# Patient Record
Sex: Male | Born: 1935 | Race: White | Hispanic: No | Marital: Married | Smoking: Former smoker
Health system: Southern US, Community
[De-identification: ages and names within clinical notes are randomized; demographics above are authoritative.]

## PROBLEM LIST (undated history)

## (undated) DIAGNOSIS — N401 Enlarged prostate with lower urinary tract symptoms: Secondary | ICD-10-CM

## (undated) DIAGNOSIS — Z972 Presence of dental prosthetic device (complete) (partial): Secondary | ICD-10-CM

## (undated) DIAGNOSIS — N138 Other obstructive and reflux uropathy: Secondary | ICD-10-CM

## (undated) DIAGNOSIS — Z978 Presence of other specified devices: Secondary | ICD-10-CM

## (undated) DIAGNOSIS — R339 Retention of urine, unspecified: Secondary | ICD-10-CM

## (undated) DIAGNOSIS — I10 Essential (primary) hypertension: Secondary | ICD-10-CM

## (undated) DIAGNOSIS — D649 Anemia, unspecified: Secondary | ICD-10-CM

## (undated) DIAGNOSIS — Z973 Presence of spectacles and contact lenses: Secondary | ICD-10-CM

## (undated) DIAGNOSIS — M1990 Unspecified osteoarthritis, unspecified site: Secondary | ICD-10-CM

## (undated) HISTORY — DX: Essential (primary) hypertension: I10

## (undated) HISTORY — PX: CATARACT EXTRACTION: SUR2

## (undated) HISTORY — PX: BLEPHAROPLASTY: SUR158

## (undated) HISTORY — PX: CATARACT EXTRACTION, BILATERAL: SHX1313

---
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
## 2016-08-15 DIAGNOSIS — H02214 Cicatricial lagophthalmos left upper eyelid: Secondary | ICD-10-CM | POA: Diagnosis not present

## 2016-08-15 DIAGNOSIS — H26493 Other secondary cataract, bilateral: Secondary | ICD-10-CM | POA: Diagnosis not present

## 2016-11-08 DIAGNOSIS — E559 Vitamin D deficiency, unspecified: Secondary | ICD-10-CM | POA: Diagnosis not present

## 2016-11-08 DIAGNOSIS — Z0000 Encounter for general adult medical examination without abnormal findings: Secondary | ICD-10-CM | POA: Diagnosis not present

## 2016-11-15 DIAGNOSIS — Z0000 Encounter for general adult medical examination without abnormal findings: Secondary | ICD-10-CM | POA: Diagnosis not present

## 2016-12-04 DIAGNOSIS — Z23 Encounter for immunization: Secondary | ICD-10-CM | POA: Diagnosis not present

## 2017-05-23 DIAGNOSIS — L821 Other seborrheic keratosis: Secondary | ICD-10-CM | POA: Diagnosis not present

## 2017-05-23 DIAGNOSIS — L720 Epidermal cyst: Secondary | ICD-10-CM | POA: Diagnosis not present

## 2017-05-23 DIAGNOSIS — D229 Melanocytic nevi, unspecified: Secondary | ICD-10-CM | POA: Diagnosis not present

## 2017-08-07 DIAGNOSIS — H26493 Other secondary cataract, bilateral: Secondary | ICD-10-CM | POA: Diagnosis not present

## 2017-10-17 ENCOUNTER — Ambulatory Visit (INDEPENDENT_AMBULATORY_CARE_PROVIDER_SITE_OTHER): Payer: Medicare Other | Admitting: Family Medicine

## 2017-10-17 ENCOUNTER — Encounter: Payer: Self-pay | Admitting: Family Medicine

## 2017-10-17 VITALS — BP 138/76 | HR 55 | Temp 98.5°F | Ht 70.0 in | Wt 183.6 lb

## 2017-10-17 DIAGNOSIS — H02125 Mechanical ectropion of left lower eyelid: Secondary | ICD-10-CM | POA: Insufficient documentation

## 2017-10-17 DIAGNOSIS — R5383 Other fatigue: Secondary | ICD-10-CM

## 2017-10-17 DIAGNOSIS — R739 Hyperglycemia, unspecified: Secondary | ICD-10-CM | POA: Diagnosis not present

## 2017-10-17 DIAGNOSIS — R030 Elevated blood-pressure reading, without diagnosis of hypertension: Secondary | ICD-10-CM | POA: Diagnosis not present

## 2017-10-17 DIAGNOSIS — H26493 Other secondary cataract, bilateral: Secondary | ICD-10-CM | POA: Insufficient documentation

## 2017-10-17 DIAGNOSIS — H18519 Endothelial corneal dystrophy, unspecified eye: Secondary | ICD-10-CM | POA: Insufficient documentation

## 2017-10-17 DIAGNOSIS — Z125 Encounter for screening for malignant neoplasm of prostate: Secondary | ICD-10-CM | POA: Diagnosis not present

## 2017-10-17 DIAGNOSIS — Z13220 Encounter for screening for lipoid disorders: Secondary | ICD-10-CM | POA: Diagnosis not present

## 2017-10-17 DIAGNOSIS — H02214 Cicatricial lagophthalmos left upper eyelid: Secondary | ICD-10-CM | POA: Insufficient documentation

## 2017-10-17 DIAGNOSIS — H01015 Ulcerative blepharitis left lower eyelid: Secondary | ICD-10-CM | POA: Insufficient documentation

## 2017-10-17 DIAGNOSIS — H1851 Endothelial corneal dystrophy: Secondary | ICD-10-CM

## 2017-10-17 DIAGNOSIS — R972 Elevated prostate specific antigen [PSA]: Secondary | ICD-10-CM | POA: Insufficient documentation

## 2017-10-17 DIAGNOSIS — H0014 Chalazion left upper eyelid: Secondary | ICD-10-CM | POA: Insufficient documentation

## 2017-10-17 LAB — LIPID PANEL
Cholesterol: 157 mg/dL (ref 0–200)
HDL: 51.4 mg/dL (ref >39.00)
NonHDL: 105.5
Total CHOL/HDL Ratio: 3
Triglycerides: 221 mg/dL — ABNORMAL HIGH (ref 0.0–149.0)
VLDL: 44.2 mg/dL — ABNORMAL HIGH (ref 0.0–40.0)

## 2017-10-17 LAB — COMPREHENSIVE METABOLIC PANEL
ALT: 15 U/L (ref 0–53)
AST: 15 U/L (ref 0–37)
Albumin: 4.2 g/dL (ref 3.5–5.2)
Alkaline Phosphatase: 67 U/L (ref 39–117)
BUN: 24 mg/dL — ABNORMAL HIGH (ref 6–23)
CO2: 31 mEq/L (ref 19–32)
Calcium: 9.3 mg/dL (ref 8.4–10.5)
Chloride: 106 mEq/L (ref 96–112)
Creatinine, Ser: 1.01 mg/dL (ref 0.40–1.50)
GFR: 74.86 mL/min (ref >60.00)
Glucose, Bld: 102 mg/dL — ABNORMAL HIGH (ref 70–99)
Potassium: 4.5 mEq/L (ref 3.5–5.1)
Sodium: 142 mEq/L (ref 135–145)
Total Bilirubin: 0.6 mg/dL (ref 0.2–1.2)
Total Protein: 6.3 g/dL (ref 6.0–8.3)

## 2017-10-17 LAB — CBC WITH DIFFERENTIAL/PLATELET
Basophils Absolute: 0 10*3/uL (ref 0.0–0.1)
Basophils Relative: 0.6 % (ref 0.0–3.0)
Eosinophils Absolute: 0.2 10*3/uL (ref 0.0–0.7)
Eosinophils Relative: 2.8 % (ref 0.0–5.0)
HCT: 40.7 % (ref 39.0–52.0)
Hemoglobin: 14 g/dL (ref 13.0–17.0)
Lymphocytes Relative: 24.1 % (ref 12.0–46.0)
Lymphs Abs: 1.4 10*3/uL (ref 0.7–4.0)
MCHC: 34.4 g/dL (ref 30.0–36.0)
MCV: 95.7 fl (ref 78.0–100.0)
Monocytes Absolute: 0.6 10*3/uL (ref 0.1–1.0)
Monocytes Relative: 11.3 % (ref 3.0–12.0)
Neutro Abs: 3.4 10*3/uL (ref 1.4–7.7)
Neutrophils Relative %: 61.2 % (ref 43.0–77.0)
Platelets: 140 10*3/uL — ABNORMAL LOW (ref 150.0–400.0)
RBC: 4.25 Mil/uL (ref 4.22–5.81)
RDW: 13.8 % (ref 11.5–15.5)
WBC: 5.6 10*3/uL (ref 4.0–10.5)

## 2017-10-17 LAB — TSH: TSH: 1.95 u[IU]/mL (ref 0.35–4.50)

## 2017-10-17 LAB — PSA: PSA: 8.6 ng/mL — ABNORMAL HIGH (ref 0.10–4.00)

## 2017-10-17 LAB — HEMOGLOBIN A1C: Hgb A1c MFr Bld: 5.8 % (ref 4.6–6.5)

## 2017-10-17 LAB — LDL CHOLESTEROL, DIRECT: Direct LDL: 73 mg/dL

## 2017-10-17 NOTE — Progress Notes (Signed)
Terrence Hinton is a 84 y.o. male is here to [place].   Patient Care Team: Briscoe Deutscher, DO as PCP - General (Family Medicine)   History of Present Illness:   Terrence Hinton, CMA acting as scribe for Dr. Briscoe Deutscher.   HPI: Patient in office to establish care. He was sent here by wife who is also a patient of Dr. Juleen Hinton. He has not issues to talk about today. See Assessment and Plan section for Problem Based Charting of issues discussed today.   Health Maintenance Due  Topic Date Due  . TETANUS/TDAP  02/26/1953  . PNA vac Low Risk Adult (1 of 2 - PCV13) 02/27/1999  . INFLUENZA VACCINE  10/11/2017   Depression screen PHQ 2/9 10/17/2017  Decreased Interest 0  Down, Depressed, Hopeless 0  PHQ - 2 Score 0  Altered sleeping 0  Tired, decreased energy 0  Change in appetite 0  Feeling bad or failure about yourself  0  Trouble concentrating 0  Moving slowly or fidgety/restless 0  Suicidal thoughts 0  PHQ-9 Score 0  Difficult doing work/chores Not difficult at all    PMHx, SurgHx, SocialHx, Medications, and Allergies were reviewed in the Visit Navigator and updated as appropriate.   History reviewed. No pertinent past medical history.  Past Surgical History:  Procedure Laterality Date  . TONSILLECTOMY  1945   Family History  Problem Relation Age of Onset  . Heart attack Mother   . Cancer Father   . Early death Father     Social History   Tobacco Use  . Smoking status: Former Smoker    Last attempt to quit: 10/17/1977    Years since quitting: 40.0  . Smokeless tobacco: Never Used  Substance Use Topics  . Alcohol use: Not Currently  . Drug use: Never   Current Medications and Allergies:   .  aspirin EC 81 MG tablet, Take 81 mg by mouth daily., Disp: , Rfl:  .  glucosamine-chondroitin 500-400 MG tablet, Take 1 tablet by mouth 3 (three) times daily., Disp: , Rfl:  .  Multiple Vitamin (MULTIVITAMIN) capsule, Take 1 capsule by mouth daily., Disp: , Rfl:  .   Omega-3 Fatty Acids (FISH OIL) 1000 MG CAPS, Take by mouth., Disp: , Rfl:  .  PRENAT-FECBN-FEBISG-FA-FISHOIL PO, Take by mouth., Disp: , Rfl:   No Known Allergies   Review of Systems:   Pertinent items are noted in the HPI. Otherwise, ROS is negative.  Vitals:   Vitals:   10/17/17 1255 10/17/17 1318  BP: (!) 148/82 138/76  Pulse: (!) 55   Temp: 98.5 F (36.9 C)   TempSrc: Oral   SpO2: 96%   Weight: 183 lb 9.6 oz (83.3 kg)   Height: 5\' 10"  (1.778 m)      Body mass index is 26.34 kg/m.  Physical Exam:   Physical Exam  Constitutional: He is oriented to person, place, and time. He appears well-developed and well-nourished. No distress.  HENT:  Head: Normocephalic and atraumatic.  Right Ear: External ear normal.  Left Ear: External ear normal.  Nose: Nose normal.  Mouth/Throat: Oropharynx is clear and moist.  Eyes: Pupils are equal, round, and reactive to light. Conjunctivae and EOM are normal.  Neck: Normal range of motion. Neck supple.  Cardiovascular: Normal rate, regular rhythm, normal heart sounds and intact distal pulses.  Pulmonary/Chest: Effort normal and breath sounds normal.  Abdominal: Soft. Bowel sounds are normal.  Musculoskeletal: Normal range of motion.  Neurological: He is  alert and oriented to person, place, and time.  Skin: Skin is warm and dry.  Psychiatric: He has a normal mood and affect. His behavior is normal. Judgment and thought content normal.  Nursing note and vitals reviewed.  Assessment and Plan:   [name] was seen today for establish care.  Diagnoses and all orders for this visit:  Elevated BP without diagnosis of hypertension -     CBC with Differential/Platelet -     Comprehensive metabolic panel -     TSH  Other fatigue -     CBC with Differential/Platelet -     Comprehensive metabolic panel -     Lipid panel -     TSH  Screening, lipid -     Lipid panel  Screening PSA (prostate specific antigen)  Hyperglycemia -      Hemoglobin A1c  Elevated PSA -     PSA   . Reviewed expectations re: course of current medical issues. . Discussed self-management of symptoms. . Outlined signs and symptoms indicating need for more acute intervention. . Patient verbalized understanding and all questions were answered. Marland Kitchen Health Maintenance issues including appropriate healthy diet, exercise, and smoking avoidance were discussed with patient. . See orders for this visit as documented in the electronic medical record. . Patient received an After Visit Summary.  CMA served as Education administrator during this visit. History, Physical, and Plan performed by medical provider. The above documentation has been reviewed and is accurate and complete. Briscoe Deutscher, D.O.  Briscoe Deutscher, DO [place], [facility] 10/17/2017

## 2017-12-03 DIAGNOSIS — Z23 Encounter for immunization: Secondary | ICD-10-CM | POA: Diagnosis not present

## 2018-02-09 DIAGNOSIS — S46819A Strain of other muscles, fascia and tendons at shoulder and upper arm level, unspecified arm, initial encounter: Secondary | ICD-10-CM | POA: Diagnosis not present

## 2018-11-27 DIAGNOSIS — Z23 Encounter for immunization: Secondary | ICD-10-CM | POA: Diagnosis not present

## 2018-11-28 ENCOUNTER — Telehealth: Payer: Self-pay | Admitting: Physical Therapy

## 2018-11-28 NOTE — Telephone Encounter (Signed)
Copied from [place] [id number]. Topic: General - Other >> Nov 28, 2018  1:54 PM Leward Quan A wrote: Reason for CRM: Patient called to request a referral from Dr Juleen China for a new PCP when she leaves. Please call Ph# [phone] >> Nov 28, 2018  2:00 PM Keene Breath wrote: Patient called again because he said the line was lost.  Patient would like the doctor or nurse to call him regarding a referral as soon as possible.  CB# [phone]

## 2018-11-28 NOTE — Telephone Encounter (Signed)
Called patient made app with [facility].

## 2018-12-26 ENCOUNTER — Other Ambulatory Visit: Payer: Self-pay

## 2018-12-26 ENCOUNTER — Encounter: Payer: Self-pay | Admitting: Family Medicine

## 2018-12-26 ENCOUNTER — Ambulatory Visit (INDEPENDENT_AMBULATORY_CARE_PROVIDER_SITE_OTHER): Payer: Medicare Other | Admitting: Family Medicine

## 2018-12-26 VITALS — BP 166/79 | HR 50 | Temp 98.5°F | Ht 70.0 in | Wt 184.2 lb

## 2018-12-26 DIAGNOSIS — M1990 Unspecified osteoarthritis, unspecified site: Secondary | ICD-10-CM

## 2018-12-26 DIAGNOSIS — R202 Paresthesia of skin: Secondary | ICD-10-CM | POA: Diagnosis not present

## 2018-12-26 DIAGNOSIS — R030 Elevated blood-pressure reading, without diagnosis of hypertension: Secondary | ICD-10-CM | POA: Diagnosis not present

## 2018-12-26 DIAGNOSIS — R739 Hyperglycemia, unspecified: Secondary | ICD-10-CM

## 2018-12-26 LAB — CBC
HCT: 40.1 % (ref 39.0–52.0)
Hemoglobin: 13.6 g/dL (ref 13.0–17.0)
MCHC: 33.8 g/dL (ref 30.0–36.0)
MCV: 96.3 fl (ref 78.0–100.0)
Platelets: 132 10*3/uL — ABNORMAL LOW (ref 150.0–400.0)
RBC: 4.17 Mil/uL — ABNORMAL LOW (ref 4.22–5.81)
RDW: 13.6 % (ref 11.5–15.5)
WBC: 5.1 10*3/uL (ref 4.0–10.5)

## 2018-12-26 LAB — COMPREHENSIVE METABOLIC PANEL
ALT: 17 U/L (ref 0–53)
AST: 19 U/L (ref 0–37)
Albumin: 4.2 g/dL (ref 3.5–5.2)
Alkaline Phosphatase: 62 U/L (ref 39–117)
BUN: 23 mg/dL (ref 6–23)
CO2: 27 mEq/L (ref 19–32)
Calcium: 8.8 mg/dL (ref 8.4–10.5)
Chloride: 107 mEq/L (ref 96–112)
Creatinine, Ser: 0.97 mg/dL (ref 0.40–1.50)
GFR: 73.59 mL/min (ref >60.00)
Glucose, Bld: 101 mg/dL — ABNORMAL HIGH (ref 70–99)
Potassium: 4.1 mEq/L (ref 3.5–5.1)
Sodium: 142 mEq/L (ref 135–145)
Total Bilirubin: 0.8 mg/dL (ref 0.2–1.2)
Total Protein: 6.2 g/dL (ref 6.0–8.3)

## 2018-12-26 LAB — TSH: TSH: 3.01 u[IU]/mL (ref 0.35–4.50)

## 2018-12-26 LAB — HEMOGLOBIN A1C: Hgb A1c MFr Bld: 5.8 % (ref 4.6–6.5)

## 2018-12-26 NOTE — Assessment & Plan Note (Signed)
Chronic problem.  Stable.  Continue glucosamine-chondroitin.

## 2018-12-26 NOTE — Progress Notes (Signed)
   Chief Complaint:  Terrence Hinton is a 85 y.o. male who presents today with a chief complaint of tingling and to transfer care.   Assessment/Plan:  Osteoarthritis Chronic problem.  Stable.  Continue glucosamine-chondroitin.  Elevated BP without diagnosis of hypertension Chronic problem.  Above goal today.  At goal at last office visit.  Will continue home blood pressure monitoring with goal 150/90 per JNC 8 guidelines.  Discussed lifestyle modifications including low-salt diet and regular exercise. Check CBC, CMET, and TSH.   Hyperglycemia Check A1c.     Subjective:  HPI:  # Tingling Patient had an episode few months ago of tingling in his lower neck and upper back.  This radiated into his shoulder and face.  He worked on stretches and exercise and had significant provement.  Symptoms have been resolved for quite a while now.  He is concerned about potentially pinched nerve.  Does not currently have any symptoms.  # Osteoarthritis -Takes daily glucosamine-chondroitin.  # Elevated BP Readings -Not currently on any medications.  Has been checking at home but does not know the numbers.  No reported chest pain or shortness of breath.  ROS: Per HPI  PMH: He reports that he quit smoking about 41 years ago. He has never used smokeless tobacco. He reports previous alcohol use. He reports that he does not use drugs.      Objective:  Physical Exam: BP (!) 166/79   Pulse (!) 50   Temp 98.5 F (36.9 C)   Ht 5\' 10"  (1.778 m)   Wt 184 lb 4 oz (83.6 kg)   SpO2 97%   BMI 26.44 kg/m   Gen: NAD, resting comfortably CV: Regular rate and rhythm with no murmurs appreciated Pulm: Normal work of breathing, clear to auscultation bilaterally with no crackles, wheezes, or rhonchi GI: Normal bowel sounds present. Soft, Nontender, Nondistended. MSK: No edema, cyanosis, or clubbing noted Skin: Warm, dry Neuro: Grossly normal, moves all extremities Psych: Normal affect and thought content        Demauri Advincula M. Jerline Pain, MD 12/26/2018 9:00 AM

## 2018-12-26 NOTE — Patient Instructions (Signed)
It was very nice to see you today!  I think the tingling in your back was from a pinched nerve.  Please let me know if this returns.  Please keep a close eye on your blood pressures and let me know if persistently 150/90 or higher.  We will check blood work today.  Come back to see me in 1 year for your next physical, or sooner if needed.  Take care, Dr Jerline Pain  Please try these tips to maintain a healthy lifestyle:   Eat at least 3 REAL meals and 1-2 snacks per day.  Aim for no more than 5 hours between eating.  If you eat breakfast, please do so within one hour of getting up.    Obtain twice as many fruits/vegetables as protein or carbohydrate foods for both lunch and dinner. (Half of each meal should be fruits/vegetables, one quarter protein, and one quarter starchy carbs)   Cut down on sweet beverages. This includes juice, soda, and sweet tea.    Exercise at least 150 minutes every week.    DASH Eating Plan DASH stands for "Dietary Approaches to Stop Hypertension." The DASH eating plan is a healthy eating plan that has been shown to reduce high blood pressure (hypertension). It may also reduce your risk for type 2 diabetes, heart disease, and stroke. The DASH eating plan may also help with weight loss. What are tips for following this plan?  General guidelines  Avoid eating more than 2,300 mg (milligrams) of salt (sodium) a day. If you have hypertension, you may need to reduce your sodium intake to 1,500 mg a day.  Limit alcohol intake to no more than 1 drink a day for nonpregnant women and 2 drinks a day for men. One drink equals 12 oz of beer, 5 oz of wine, or 1 oz of hard liquor.  Work with your health care provider to maintain a healthy body weight or to lose weight. Ask what an ideal weight is for you.  Get at least 30 minutes of exercise that causes your heart to beat faster (aerobic exercise) most days of the week. Activities may include walking, swimming, or  biking.  Work with your health care provider or diet and nutrition specialist (dietitian) to adjust your eating plan to your individual calorie needs. Reading food labels   Check food labels for the amount of sodium per serving. Choose foods with less than 5 percent of the Daily Value of sodium. Generally, foods with less than 300 mg of sodium per serving fit into this eating plan.  To find whole grains, look for the word "whole" as the first word in the ingredient list. Shopping  Buy products labeled as "low-sodium" or "no salt added."  Buy fresh foods. Avoid canned foods and premade or frozen meals. Cooking  Avoid adding salt when cooking. Use salt-free seasonings or herbs instead of table salt or sea salt. Check with your health care provider or pharmacist before using salt substitutes.  Do not fry foods. Cook foods using healthy methods such as baking, boiling, grilling, and broiling instead.  Cook with heart-healthy oils, such as olive, canola, soybean, or sunflower oil. Meal planning  Eat a balanced diet that includes: ? 5 or more servings of fruits and vegetables each day. At each meal, try to fill half of your plate with fruits and vegetables. ? Up to 6-8 servings of whole grains each day. ? Less than 6 oz of lean meat, poultry, or fish each day. A  3-oz serving of meat is about the same size as a deck of cards. One egg equals 1 oz. ? 2 servings of low-fat dairy each day. ? A serving of nuts, seeds, or beans 5 times each week. ? Heart-healthy fats. Healthy fats called Omega-3 fatty acids are found in foods such as flaxseeds and coldwater fish, like sardines, salmon, and mackerel.  Limit how much you eat of the following: ? Canned or prepackaged foods. ? Food that is high in trans fat, such as fried foods. ? Food that is high in saturated fat, such as fatty meat. ? Sweets, desserts, sugary drinks, and other foods with added sugar. ? Full-fat dairy products.  Do not salt  foods before eating.  Try to eat at least 2 vegetarian meals each week.  Eat more home-cooked food and less restaurant, buffet, and fast food.  When eating at a restaurant, ask that your food be prepared with less salt or no salt, if possible. What foods are recommended? The items listed may not be a complete list. Talk with your dietitian about what dietary choices are best for you. Grains Whole-grain or whole-wheat bread. Whole-grain or whole-wheat pasta. Brown rice. [place]. Bulgur. Whole-grain and low-sodium cereals. Pita bread. Low-fat, low-sodium crackers. Whole-wheat flour tortillas. Vegetables Fresh or frozen vegetables (raw, steamed, roasted, or grilled). Low-sodium or reduced-sodium tomato and vegetable juice. Low-sodium or reduced-sodium tomato sauce and tomato paste. Low-sodium or reduced-sodium canned vegetables. Fruits All fresh, dried, or frozen fruit. Canned fruit in natural juice (without added sugar). Meat and other protein foods Skinless chicken or [place]. Ground chicken or [place]. Pork with fat trimmed off. Fish and seafood. Egg whites. Dried beans, peas, or lentils. Unsalted nuts, nut butters, and seeds. Unsalted canned beans. Lean cuts of beef with fat trimmed off. Low-sodium, lean deli meat. Dairy Low-fat (1%) or fat-free (skim) milk. Fat-free, low-fat, or reduced-fat cheeses. Nonfat, low-sodium ricotta or cottage cheese. Low-fat or nonfat yogurt. Low-fat, low-sodium cheese. Fats and oils Soft margarine without trans fats. Vegetable oil. Low-fat, reduced-fat, or light mayonnaise and salad dressings (reduced-sodium). Canola, safflower, olive, soybean, and sunflower oils. Avocado. Seasoning and other foods Herbs. Spices. Seasoning mixes without salt. Unsalted popcorn and pretzels. Fat-free sweets. What foods are not recommended? The items listed may not be a complete list. Talk with your dietitian about what dietary choices are best for you. Grains Baked goods  made with fat, such as croissants, muffins, or some breads. Dry pasta or rice meal packs. Vegetables Creamed or fried vegetables. Vegetables in a cheese sauce. Regular canned vegetables (not low-sodium or reduced-sodium). Regular canned tomato sauce and paste (not low-sodium or reduced-sodium). Regular tomato and vegetable juice (not low-sodium or reduced-sodium). Angie Fava. Olives. Fruits Canned fruit in a light or heavy syrup. Fried fruit. Fruit in cream or butter sauce. Meat and other protein foods Fatty cuts of meat. Ribs. Fried meat. Berniece Salines. Sausage. Bologna and other processed lunch meats. Salami. Fatback. Hotdogs. Bratwurst. Salted nuts and seeds. Canned beans with added salt. Canned or smoked fish. Whole eggs or egg yolks. Chicken or [place] with skin. Dairy Whole or 2% milk, cream, and half-and-half. Whole or full-fat cream cheese. Whole-fat or sweetened yogurt. Full-fat cheese. Nondairy creamers. Whipped toppings. Processed cheese and cheese spreads. Fats and oils Butter. Stick margarine. Lard. Shortening. Ghee. Bacon fat. Tropical oils, such as coconut, palm kernel, or palm oil. Seasoning and other foods Salted popcorn and pretzels. Onion salt, garlic salt, seasoned salt, table salt, and sea salt. Worcestershire sauce. Tartar sauce.  Barbecue sauce. Teriyaki sauce. Soy sauce, including reduced-sodium. Steak sauce. Canned and packaged gravies. Fish sauce. Oyster sauce. Cocktail sauce. Horseradish that you find on the shelf. Ketchup. Mustard. Meat flavorings and tenderizers. Bouillon cubes. Hot sauce and Tabasco sauce. Premade or packaged marinades. Premade or packaged taco seasonings. Relishes. Regular salad dressings. Where to find more information:  National Heart, Lung, and [place]: https://wilson-eaton.com/  American Heart Association: www.heart.org Summary  The DASH eating plan is a healthy eating plan that has been shown to reduce high blood pressure (hypertension). It may also reduce  your risk for type 2 diabetes, heart disease, and stroke.  With the DASH eating plan, you should limit salt (sodium) intake to 2,300 mg a day. If you have hypertension, you may need to reduce your sodium intake to 1,500 mg a day.  When on the DASH eating plan, aim to eat more fresh fruits and vegetables, whole grains, lean proteins, low-fat dairy, and heart-healthy fats.  Work with your health care provider or diet and nutrition specialist (dietitian) to adjust your eating plan to your individual calorie needs. This information is not intended to replace advice given to you by your health care provider. Make sure you discuss any questions you have with your health care provider. Document Released: 02/16/2011 Document Revised: 02/09/2017 Document Reviewed: 02/21/2016 Elsevier Patient Education  2020 [employer].

## 2018-12-26 NOTE — Assessment & Plan Note (Addendum)
Chronic problem.  Above goal today.  At goal at last office visit.  Will continue home blood pressure monitoring with goal 150/90 per JNC 8 guidelines.  Discussed lifestyle modifications including low-salt diet and regular exercise. Check CBC, CMET, and TSH.

## 2018-12-26 NOTE — Progress Notes (Signed)
Please inform patient of the following:  His blood work is all stable compared to last year. Do not need to make any changes to his treatment plan at this time. Recommend that he keep up the good work and we can recheck in a year.  Algis Greenhouse. Jerline Pain, MD 12/26/2018 3:37 PM

## 2019-01-13 ENCOUNTER — Telehealth: Payer: Self-pay | Admitting: Family Medicine

## 2019-01-13 NOTE — Telephone Encounter (Signed)
See note  Copied from [place] [id number]. Topic: General - Inquiry >> Jan 13, 2019  3:55 PM Terrence Hinton wrote: Reason for CRM: Pt called in and stated that he was not sure why there was not Hinton panel ran to check his psa and cholesterol back on 10/15?  Please advise  Best number [phone]

## 2019-01-14 NOTE — Telephone Encounter (Signed)
We checked labs for his blood pressure and not preventative labs. He can come back to get lipid panel and PSA if he wishes - does not need to see me.  We can also wait until next year if he prefers.  Algis Greenhouse. Jerline Pain, MD 01/14/2019 12:21 PM

## 2019-01-14 NOTE — Telephone Encounter (Signed)
Please advise 

## 2019-01-15 ENCOUNTER — Telehealth: Payer: Self-pay | Admitting: Family Medicine

## 2019-01-15 ENCOUNTER — Other Ambulatory Visit: Payer: Self-pay

## 2019-01-15 DIAGNOSIS — Z125 Encounter for screening for malignant neoplasm of prostate: Secondary | ICD-10-CM

## 2019-01-15 DIAGNOSIS — Z13220 Encounter for screening for lipoid disorders: Secondary | ICD-10-CM

## 2019-01-15 NOTE — Telephone Encounter (Signed)
Copied from [place] [phone]. Topic: General - Other >> Jan 15, 2019 11:18 AM Carolyn Stare wrote: Pt call to schedule lab but there was no order and req a call back

## 2019-01-15 NOTE — Telephone Encounter (Signed)
Patient scheduled for labs

## 2019-01-15 NOTE — Telephone Encounter (Signed)
Labs ordered patient will schedule lab visit

## 2019-01-15 NOTE — Addendum Note (Signed)
Addended by: Loralyn Freshwater on: 01/15/2019 01:31 PM   Modules accepted: Orders

## 2019-01-16 ENCOUNTER — Other Ambulatory Visit: Payer: Self-pay

## 2019-01-16 ENCOUNTER — Other Ambulatory Visit (INDEPENDENT_AMBULATORY_CARE_PROVIDER_SITE_OTHER): Payer: Medicare Other

## 2019-01-16 DIAGNOSIS — Z13220 Encounter for screening for lipoid disorders: Secondary | ICD-10-CM

## 2019-01-16 DIAGNOSIS — Z125 Encounter for screening for malignant neoplasm of prostate: Secondary | ICD-10-CM | POA: Diagnosis not present

## 2019-01-16 LAB — LIPID PANEL
Cholesterol: 167 mg/dL (ref 0–200)
HDL: 54.6 mg/dL (ref >39.00)
LDL Cholesterol: 85 mg/dL (ref 0–99)
NonHDL: 112.14
Total CHOL/HDL Ratio: 3
Triglycerides: 135 mg/dL (ref 0.0–149.0)
VLDL: 27 mg/dL (ref 0.0–40.0)

## 2019-01-16 LAB — PSA, MEDICARE: PSA: 4.19 ng/ml — ABNORMAL HIGH (ref 0.10–4.00)

## 2019-01-17 NOTE — Progress Notes (Signed)
Please inform patient of the following:  Cholesterol levels are NORMAL. PSA level much improved from last year. Would like for him to keep up the good work and we can recheck in a year or so.  Terrence Hinton. Jerline Pain, MD 01/17/2019 8:30 AM

## 2019-06-03 DIAGNOSIS — H0012 Chalazion right lower eyelid: Secondary | ICD-10-CM | POA: Diagnosis not present

## 2019-06-03 DIAGNOSIS — Z961 Presence of intraocular lens: Secondary | ICD-10-CM | POA: Diagnosis not present

## 2019-06-03 DIAGNOSIS — H35373 Puckering of macula, bilateral: Secondary | ICD-10-CM | POA: Diagnosis not present

## 2019-06-09 ENCOUNTER — Telehealth: Payer: Self-pay | Admitting: Family Medicine

## 2019-06-09 NOTE — Telephone Encounter (Signed)
Left message for patient to call back and schedule Medicare Annual Wellness Visit (AWV) either virtually/audio only OR in office. Whatever the patients preference is.  No hx; please schedule at anytime with [facility]-Nurse Health Advisor at [facility].

## 2019-06-24 ENCOUNTER — Ambulatory Visit (INDEPENDENT_AMBULATORY_CARE_PROVIDER_SITE_OTHER): Payer: Medicare Other

## 2019-06-24 ENCOUNTER — Other Ambulatory Visit: Payer: Self-pay

## 2019-06-24 DIAGNOSIS — Z0000 Encounter for general adult medical examination without abnormal findings: Secondary | ICD-10-CM

## 2019-06-24 NOTE — Progress Notes (Signed)
This visit is being conducted via phone call due to the COVID-19 pandemic. This patient has given me verbal consent via phone to conduct this visit, patient states they are participating from their home address. Some vital signs may be absent or patient reported.   Patient identification: identified by name, DOB, and current address.  Location provider: [facility], Office Persons participating in the virtual visit: Denman George LPN, patient, and Dr. Dimas Chyle   Subjective:   Terrence Hinton is a 86 y.o. male who presents for Medicare Annual/Subsequent preventive examination.  Review of Systems:   Cardiac Risk Factors include: advanced age (>20men, >66 women);male gender    Objective:    Vitals: There were no vitals taken for this visit.  There is no height or weight on file to calculate BMI.  Advanced Directives 06/24/2019  Does Patient Have a Medical Advance Directive? Yes  Type of Advance Directive Living will;Healthcare Power of Attorney  Does patient want to make changes to medical advance directive? No - Patient declined  Copy of [place] in Chart? No - copy requested    Tobacco Social History   Tobacco Use  Smoking Status Former Smoker  . Quit date: 10/17/1977  . Years since quitting: 41.7  Smokeless Tobacco Never Used     Counseling given: Not Answered   Clinical Intake:  Pre-visit preparation completed: Yes  Pain : No/denies pain  Diabetes: No  How often do you need to have someone help you when you read instructions, pamphlets, or other written materials from your doctor or pharmacy?: 1 - Never  Interpreter Needed?: No  Information entered by :: Denman George LPN  History reviewed. No pertinent past medical history. Past Surgical History:  Procedure Laterality Date  . CATARACT EXTRACTION    . TONSILLECTOMY  1945   Family History  Problem Relation Age of Onset  . Heart attack Mother   . Cancer Father   . Early death Father      Social History   Socioeconomic History  . Marital status: Married    Spouse name: Not on file  . Number of children: 3  . Years of education: 60  . Highest education level: Not on file  Occupational History  . Occupation: Retired     Comment: Nurse, learning disability   Tobacco Use  . Smoking status: Former Smoker    Quit date: 10/17/1977    Years since quitting: 41.7  . Smokeless tobacco: Never Used  Substance and Sexual Activity  . Alcohol use: Not Currently  . Drug use: Never  . Sexual activity: Not on file  Other Topics Concern  . Not on file  Social History Narrative   Enjoys golfing    Social Determinants of Health   Financial Resource Strain:   . Difficulty of Paying Living Expenses:   Food Insecurity:   . Worried About Charity fundraiser in the Last Year:   . Arboriculturist in the Last Year:   Transportation Needs:   . Film/video editor (Medical):   Marland Kitchen Lack of Transportation (Non-Medical):   Physical Activity:   . Days of Exercise per Week:   . Minutes of Exercise per Session:   Stress:   . Feeling of Stress :   Social Connections:   . Frequency of Communication with Friends and Family:   . Frequency of Social Gatherings with Friends and Family:   . Attends Religious Services:   . Active Member of Clubs  or Organizations:   . Attends Archivist Meetings:   Marland Kitchen Marital Status:     Outpatient Encounter Medications as of 06/24/2019  Medication Sig  . aspirin EC 81 MG tablet Take 81 mg by mouth daily.  Marland Kitchen glucosamine-chondroitin 500-400 MG tablet Take 1 tablet by mouth 3 (three) times daily.  . Multiple Vitamin (MULTIVITAMIN) capsule Take 1 capsule by mouth daily.  . Omega-3 Fatty Acids (FISH OIL) 1000 MG CAPS Take by mouth.   No facility-administered encounter medications on file as of 06/24/2019.    Activities of Daily Living In your present state of health, do you have any difficulty performing the following activities: 06/24/2019  Hearing? N   Vision? N  Difficulty concentrating or making decisions? N  Walking or climbing stairs? N  Dressing or bathing? N  Doing errands, shopping? N  Preparing Food and eating ? N  Using the Toilet? N  In the past six months, have you accidently leaked urine? N  Do you have problems with loss of bowel control? N  Managing your Medications? N  Managing your Finances? N  Housekeeping or managing your Housekeeping? N  Some recent data might be hidden    Patient Care Team: Vivi Barrack, MD as PCP - General (Family Medicine) Alanda Slim Neena Rhymes, MD as Consulting Physician (Ophthalmology)   Assessment:   This is a routine wellness examination for Terrence Hinton.  Exercise Activities and Dietary recommendations Current Exercise Habits: Home exercise routine, Type of exercise: Other - see comments(golfing), Time (Minutes): 45, Frequency (Times/Week): 3, Weekly Exercise (Minutes/Week): 135, Intensity: Mild  Goals   None     Fall Risk Fall Risk  06/24/2019 12/26/2018 10/17/2017  Falls in the past year? 0 0 No  Number falls in past yr: 0 - -  Injury with Fall? 0 - -  Follow up Falls evaluation completed;Education provided;Falls prevention discussed - -   Is the patient's home free of loose throw rugs in walkways, pet beds, electrical cords, etc?   yes      Grab bars in the bathroom? yes      Handrails on the stairs?   yes      Adequate lighting?   yes  Depression Screen PHQ 2/9 Scores 06/24/2019 12/26/2018 10/17/2017  PHQ - 2 Score 0 0 0  PHQ- 9 Score - 0 0    Cognitive Function- no cognitive concerns at this time    6CIT Screen 06/24/2019  What Year? 0 points  What month? 0 points  What time? 0 points  Count back from 20 0 points  Months in reverse 0 points  Repeat phrase 0 points  Total Score 0    Immunization History  Administered Date(s) Administered  . Influenza, High Dose Seasonal PF 12/18/2013, 11/16/2014, 11/25/2015, 12/04/2016    Qualifies for Shingles Vaccine?  Discussed and patient will check with pharmacy for coverage.  Patient education handout provided   Screening Tests Health Maintenance  Topic Date Due  . TETANUS/TDAP  12/26/2019 (Originally 02/26/1951)  . PNA vac Low Risk Adult (1 of 2 - PCV13) 12/26/2019 (Originally 02/27/1999)  . INFLUENZA VACCINE  10/12/2019   Cancer Screenings: Lung: Low Dose CT Chest recommended if Age 75-80 years, 30 pack-year currently smoking OR have quit w/in 15years. Patient does not qualify. Colorectal: No longer indicated      Plan:  I have personally reviewed and addressed the Medicare Annual Wellness questionnaire and have noted the following in the patient's chart:  A. Medical and social  history B. Use of alcohol, tobacco or illicit drugs  C. Current medications and supplements D. Functional ability and status E.  Nutritional status F.  Physical activity G. Advance directives H. List of other physicians I.  Hospitalizations, surgeries, and ER visits in previous 12 months J.  [place] such as hearing and vision if needed, cognitive and depression L. Referrals, records requested, and appointments- none   In addition, I have reviewed and discussed with patient certain preventive protocols, quality metrics, and best practice recommendations. A written personalized care plan for preventive services as well as general preventive health recommendations were provided to patient.   Signed,  Denman George, LPN  Nurse Health Advisor   Nurse Notes: Patient has completed Covid vaccine Therapist, music)

## 2019-06-24 NOTE — Patient Instructions (Signed)
Terrence Hinton , Thank you for taking time to come for your Medicare Wellness Visit. I appreciate your ongoing commitment to your health goals. Please review the following plan we discussed and let me know if I can assist you in the future.   Screening recommendations/referrals: Colorectal Screening: No longer indicated   Vision and Dental Exams: Recommended annual ophthalmology exams for early detection of glaucoma and other disorders of the eye Recommended annual dental exams for proper oral hygiene  Vaccinations: Influenza vaccine: completed 11/27/18 Pneumococcal vaccine: recommended  Tdap vaccine: recommended every 10 years; Please call your insurance company to determine your out of pocket expense. You also receive this vaccine at your local pharmacy or Health Dept. Shingles vaccine: You may receive this vaccine at your local pharmacy. (see handout)  Covid vaccine: Completed   Advanced directives: Please bring a copy of your POA (Power of Attorney) and/or Living Will to your next appointment.  Goals: Recommend to drink at least 6-8 8oz glasses of water per day and consume a balanced diet rich in fresh fruits and vegetables.   Next appointment: Please schedule your Annual Wellness Visit with your Nurse Health Advisor in one year.  Preventive Care 21 Years and Older, Male Preventive care refers to lifestyle choices and visits with your health care provider that can promote health and wellness. What does preventive care include?  A yearly physical exam. This is also called an annual well check.  Dental exams once or twice a year.  Routine eye exams. Ask your health care provider how often you should have your eyes checked.  Personal lifestyle choices, including:  Daily care of your teeth and gums.  Regular physical activity.  Eating a healthy diet.  Avoiding tobacco and drug use.  Limiting alcohol use.  Practicing safe sex.  Taking low doses of aspirin every day if  recommended by your health care provider..  Taking vitamin and mineral supplements as recommended by your health care provider. What happens during an annual well check? The services and screenings done by your health care provider during your annual well check will depend on your age, overall health, lifestyle risk factors, and family history of disease. Counseling  Your health care provider may ask you questions about your:  Alcohol use.  Tobacco use.  Drug use.  Emotional well-being.  Home and relationship well-being.  Sexual activity.  Eating habits.  History of falls.  Memory and ability to understand (cognition).  Work and work Statistician. Screening  You may have the following tests or measurements:  Height, weight, and BMI.  Blood pressure.  Lipid and cholesterol levels. These may be checked every 5 years, or more frequently if you are over 51 years old.  Skin check.  Lung cancer screening. You may have this screening every year starting at age 43 if you have a 30-pack-year history of smoking and currently smoke or have quit within the past 15 years.  Fecal occult blood test (FOBT) of the stool. You may have this test every year starting at age 60.  Flexible sigmoidoscopy or colonoscopy. You may have a sigmoidoscopy every 5 years or a colonoscopy every 10 years starting at age 74.  Prostate cancer screening. Recommendations will vary depending on your family history and other risks.  Hepatitis C blood test.  Hepatitis B blood test.  Sexually transmitted disease (STD) testing.  Diabetes screening. This is done by checking your blood sugar (glucose) after you have not eaten for a while (fasting). You may have  this done every 1-3 years.  Abdominal aortic aneurysm (AAA) screening. You may need this if you are a current or former smoker.  Osteoporosis. You may be screened starting at age 21 if you are at high risk. Talk with your health care provider about  your test results, treatment options, and if necessary, the need for more tests. Vaccines  Your health care provider may recommend certain vaccines, such as:  Influenza vaccine. This is recommended every year.  Tetanus, diphtheria, and acellular pertussis (Tdap, Td) vaccine. You may need a Td booster every 10 years.  Zoster vaccine. You may need this after age 37.  Pneumococcal 13-valent conjugate (PCV13) vaccine. One dose is recommended after age 10.  Pneumococcal polysaccharide (PPSV23) vaccine. One dose is recommended after age 50. Talk to your health care provider about which screenings and vaccines you need and how often you need them. This information is not intended to replace advice given to you by your health care provider. Make sure you discuss any questions you have with your health care provider. Document Released: 03/26/2015 Document Revised: 11/17/2015 Document Reviewed: 12/29/2014 Elsevier Interactive Patient Education  2017 [place] Prevention in the Home Falls can cause injuries. They can happen to people of all ages. There are many things you can do to make your home safe and to help prevent falls. What can I do on the outside of my home?  Regularly fix the edges of walkways and driveways and fix any cracks.  Remove anything that might make you trip as you walk through a door, such as a raised step or threshold.  Trim any bushes or trees on the path to your home.  Use bright outdoor lighting.  Clear any walking paths of anything that might make someone trip, such as rocks or tools.  Regularly check to see if handrails are loose or broken. Make sure that both sides of any steps have handrails.  Any raised decks and porches should have guardrails on the edges.  Have any leaves, snow, or ice cleared regularly.  Use sand or salt on walking paths during winter.  Clean up any spills in your garage right away. This includes oil or grease spills. What  can I do in the bathroom?  Use night lights.  Install grab bars by the toilet and in the tub and shower. Do not use towel bars as grab bars.  Use non-skid mats or decals in the tub or shower.  If you need to sit down in the shower, use a plastic, non-slip stool.  Keep the floor dry. Clean up any water that spills on the floor as soon as it happens.  Remove soap buildup in the tub or shower regularly.  Attach bath mats securely with double-sided non-slip rug tape.  Do not have throw rugs and other things on the floor that can make you trip. What can I do in the bedroom?  Use night lights.  Make sure that you have a light by your bed that is easy to reach.  Do not use any sheets or blankets that are too big for your bed. They should not hang down onto the floor.  Have a firm chair that has side arms. You can use this for support while you get dressed.  Do not have throw rugs and other things on the floor that can make you trip. What can I do in the kitchen?  Clean up any spills right away.  Avoid walking on wet floors.  Keep items that you use a lot in easy-to-reach places.  If you need to reach something above you, use a strong step stool that has a grab bar.  Keep electrical cords out of the way.  Do not use floor polish or wax that makes floors slippery. If you must use wax, use non-skid floor wax.  Do not have throw rugs and other things on the floor that can make you trip. What can I do with my stairs?  Do not leave any items on the stairs.  Make sure that there are handrails on both sides of the stairs and use them. Fix handrails that are broken or loose. Make sure that handrails are as long as the stairways.  Check any carpeting to make sure that it is firmly attached to the stairs. Fix any carpet that is loose or worn.  Avoid having throw rugs at the top or bottom of the stairs. If you do have throw rugs, attach them to the floor with carpet tape.  Make sure  that you have a light switch at the top of the stairs and the bottom of the stairs. If you do not have them, ask someone to add them for you. What else can I do to help prevent falls?  Wear shoes that:  Do not have high heels.  Have rubber bottoms.  Are comfortable and fit you well.  Are closed at the toe. Do not wear sandals.  If you use a stepladder:  Make sure that it is fully opened. Do not climb a closed stepladder.  Make sure that both sides of the stepladder are locked into place.  Ask someone to hold it for you, if possible.  Clearly mark and make sure that you can see:  Any grab bars or handrails.  First and last steps.  Where the edge of each step is.  Use tools that help you move around (mobility aids) if they are needed. These include:  Canes.  Walkers.  Scooters.  Crutches.  Turn on the lights when you go into a dark area. Replace any light bulbs as soon as they burn out.  Set up your furniture so you have a clear path. Avoid moving your furniture around.  If any of your floors are uneven, fix them.  If there are any pets around you, be aware of where they are.  Review your medicines with your doctor. Some medicines can make you feel dizzy. This can increase your chance of falling. Ask your doctor what other things that you can do to help prevent falls. This information is not intended to replace advice given to you by your health care provider. Make sure you discuss any questions you have with your health care provider. Document Released: 12/24/2008 Document Revised: 08/05/2015 Document Reviewed: 04/03/2014 Elsevier Interactive Patient Education  2017 [employer].

## 2019-10-14 DIAGNOSIS — M9905 Segmental and somatic dysfunction of pelvic region: Secondary | ICD-10-CM | POA: Diagnosis not present

## 2019-10-14 DIAGNOSIS — M41126 Adolescent idiopathic scoliosis, lumbar region: Secondary | ICD-10-CM | POA: Diagnosis not present

## 2019-10-14 DIAGNOSIS — M9903 Segmental and somatic dysfunction of lumbar region: Secondary | ICD-10-CM | POA: Diagnosis not present

## 2019-10-14 DIAGNOSIS — M47896 Other spondylosis, lumbar region: Secondary | ICD-10-CM | POA: Diagnosis not present

## 2019-10-15 ENCOUNTER — Ambulatory Visit: Payer: Medicare Other | Admitting: Physician Assistant

## 2019-10-15 DIAGNOSIS — M41126 Adolescent idiopathic scoliosis, lumbar region: Secondary | ICD-10-CM | POA: Diagnosis not present

## 2019-10-15 DIAGNOSIS — M9903 Segmental and somatic dysfunction of lumbar region: Secondary | ICD-10-CM | POA: Diagnosis not present

## 2019-10-15 DIAGNOSIS — M47896 Other spondylosis, lumbar region: Secondary | ICD-10-CM | POA: Diagnosis not present

## 2019-10-15 DIAGNOSIS — M9905 Segmental and somatic dysfunction of pelvic region: Secondary | ICD-10-CM | POA: Diagnosis not present

## 2019-10-15 NOTE — Progress Notes (Deleted)
Terrence Hinton is a 86 y.o. male here for back pain.  I acted as a Education administrator for [employer], PA-C [employer], [place]  History of Present Illness:   No chief complaint on file.   HPI  Back pain  Social History   Tobacco Use  . Smoking status: Former Smoker    Quit date: 10/17/1977    Years since quitting: 42.0  . Smokeless tobacco: Never Used  Substance Use Topics  . Alcohol use: Not Currently  . Drug use: Never    Past Surgical History:  Procedure Laterality Date  . CATARACT EXTRACTION    . TONSILLECTOMY  1945    Family History  Problem Relation Age of Onset  . Heart attack Mother   . Cancer Father   . Early death Father     No Known Allergies  Current Medications:   Current Outpatient Medications:  .  aspirin EC 81 MG tablet, Take 81 mg by mouth daily., Disp: , Rfl:  .  glucosamine-chondroitin 500-400 MG tablet, Take 1 tablet by mouth 3 (three) times daily., Disp: , Rfl:  .  Multiple Vitamin (MULTIVITAMIN) capsule, Take 1 capsule by mouth daily., Disp: , Rfl:  .  Omega-3 Fatty Acids (FISH OIL) 1000 MG CAPS, Take by mouth., Disp: , Rfl:    Review of Systems:   ROS  Vitals:   There were no vitals filed for this visit.   There is no height or weight on file to calculate BMI.  Physical Exam:   Physical Exam  Results for orders placed or performed in visit on 01/16/19  PSA, Medicare  Result Value Ref Range   PSA 4.19 (H) 0.10 - 4.00 ng/ml  Lipid panel  Result Value Ref Range   Cholesterol 167 0 - 200 mg/dL   Triglycerides 135.0 0 - 149 mg/dL   HDL 54.60 >39.00 mg/dL   VLDL 27.0 0.0 - 40.0 mg/dL   LDL Cholesterol 85 0 - 99 mg/dL   Total CHOL/HDL Ratio 3    NonHDL 112.14     Assessment and Plan:   There are no diagnoses linked to this encounter.  . Reviewed expectations re: course of current medical issues. . Discussed self-management of symptoms. . Outlined signs and symptoms indicating need for more acute intervention. . Patient verbalized  understanding and all questions were answered. . See orders for this visit as documented in the electronic medical record. . Patient received an After-Visit Summary.  ***  Inda Coke, PA-C

## 2019-10-17 DIAGNOSIS — M9905 Segmental and somatic dysfunction of pelvic region: Secondary | ICD-10-CM | POA: Diagnosis not present

## 2019-10-17 DIAGNOSIS — M47896 Other spondylosis, lumbar region: Secondary | ICD-10-CM | POA: Diagnosis not present

## 2019-10-17 DIAGNOSIS — M41126 Adolescent idiopathic scoliosis, lumbar region: Secondary | ICD-10-CM | POA: Diagnosis not present

## 2019-10-17 DIAGNOSIS — M9903 Segmental and somatic dysfunction of lumbar region: Secondary | ICD-10-CM | POA: Diagnosis not present

## 2019-10-20 DIAGNOSIS — M9905 Segmental and somatic dysfunction of pelvic region: Secondary | ICD-10-CM | POA: Diagnosis not present

## 2019-10-20 DIAGNOSIS — M47896 Other spondylosis, lumbar region: Secondary | ICD-10-CM | POA: Diagnosis not present

## 2019-10-20 DIAGNOSIS — M41126 Adolescent idiopathic scoliosis, lumbar region: Secondary | ICD-10-CM | POA: Diagnosis not present

## 2019-10-20 DIAGNOSIS — M9903 Segmental and somatic dysfunction of lumbar region: Secondary | ICD-10-CM | POA: Diagnosis not present

## 2019-10-22 DIAGNOSIS — M9905 Segmental and somatic dysfunction of pelvic region: Secondary | ICD-10-CM | POA: Diagnosis not present

## 2019-10-22 DIAGNOSIS — M47896 Other spondylosis, lumbar region: Secondary | ICD-10-CM | POA: Diagnosis not present

## 2019-10-22 DIAGNOSIS — M9903 Segmental and somatic dysfunction of lumbar region: Secondary | ICD-10-CM | POA: Diagnosis not present

## 2019-10-22 DIAGNOSIS — M41126 Adolescent idiopathic scoliosis, lumbar region: Secondary | ICD-10-CM | POA: Diagnosis not present

## 2019-10-24 DIAGNOSIS — M9903 Segmental and somatic dysfunction of lumbar region: Secondary | ICD-10-CM | POA: Diagnosis not present

## 2019-10-24 DIAGNOSIS — M41126 Adolescent idiopathic scoliosis, lumbar region: Secondary | ICD-10-CM | POA: Diagnosis not present

## 2019-10-24 DIAGNOSIS — M47896 Other spondylosis, lumbar region: Secondary | ICD-10-CM | POA: Diagnosis not present

## 2019-10-24 DIAGNOSIS — M9905 Segmental and somatic dysfunction of pelvic region: Secondary | ICD-10-CM | POA: Diagnosis not present

## 2019-11-03 DIAGNOSIS — M47896 Other spondylosis, lumbar region: Secondary | ICD-10-CM | POA: Diagnosis not present

## 2019-11-03 DIAGNOSIS — M9905 Segmental and somatic dysfunction of pelvic region: Secondary | ICD-10-CM | POA: Diagnosis not present

## 2019-11-03 DIAGNOSIS — M41126 Adolescent idiopathic scoliosis, lumbar region: Secondary | ICD-10-CM | POA: Diagnosis not present

## 2019-11-03 DIAGNOSIS — M9903 Segmental and somatic dysfunction of lumbar region: Secondary | ICD-10-CM | POA: Diagnosis not present

## 2019-11-10 DIAGNOSIS — M9905 Segmental and somatic dysfunction of pelvic region: Secondary | ICD-10-CM | POA: Diagnosis not present

## 2019-11-10 DIAGNOSIS — M47896 Other spondylosis, lumbar region: Secondary | ICD-10-CM | POA: Diagnosis not present

## 2019-11-10 DIAGNOSIS — M41126 Adolescent idiopathic scoliosis, lumbar region: Secondary | ICD-10-CM | POA: Diagnosis not present

## 2019-11-10 DIAGNOSIS — M9903 Segmental and somatic dysfunction of lumbar region: Secondary | ICD-10-CM | POA: Diagnosis not present

## 2019-11-12 DIAGNOSIS — M47896 Other spondylosis, lumbar region: Secondary | ICD-10-CM | POA: Diagnosis not present

## 2019-11-12 DIAGNOSIS — M9903 Segmental and somatic dysfunction of lumbar region: Secondary | ICD-10-CM | POA: Diagnosis not present

## 2019-11-12 DIAGNOSIS — M9905 Segmental and somatic dysfunction of pelvic region: Secondary | ICD-10-CM | POA: Diagnosis not present

## 2019-11-12 DIAGNOSIS — M41126 Adolescent idiopathic scoliosis, lumbar region: Secondary | ICD-10-CM | POA: Diagnosis not present

## 2019-11-13 DIAGNOSIS — Z23 Encounter for immunization: Secondary | ICD-10-CM | POA: Diagnosis not present

## 2019-11-18 DIAGNOSIS — M9905 Segmental and somatic dysfunction of pelvic region: Secondary | ICD-10-CM | POA: Diagnosis not present

## 2019-11-18 DIAGNOSIS — M47896 Other spondylosis, lumbar region: Secondary | ICD-10-CM | POA: Diagnosis not present

## 2019-11-18 DIAGNOSIS — M41126 Adolescent idiopathic scoliosis, lumbar region: Secondary | ICD-10-CM | POA: Diagnosis not present

## 2019-11-18 DIAGNOSIS — M9903 Segmental and somatic dysfunction of lumbar region: Secondary | ICD-10-CM | POA: Diagnosis not present

## 2020-01-03 DIAGNOSIS — Z23 Encounter for immunization: Secondary | ICD-10-CM | POA: Diagnosis not present

## 2020-01-07 ENCOUNTER — Telehealth: Payer: Self-pay

## 2020-01-07 NOTE — Telephone Encounter (Signed)
Nurse Assessment Nurse: Wynetta Emery, RN, Dawn Date/Time Eilene Ghazi Time): 01/06/2020 8:37:07 PM Confirm and document reason for call. If symptomatic, describe symptoms. ---Caller states her husband had a Covid booster shot on Saturday,this morning and all day he feels very tired and flushed temp 100.9, pulse 82, O2 was 89-92%. Does the patient have any new or worsening symptoms? ---Yes Will a triage be completed? ---Yes Related visit to physician within the last 2 weeks? ---Yes Does the PT have any chronic conditions? (i.e. diabetes, asthma, this includes High risk factors for pregnancy, etc.) ---No Is this a behavioral health or substance abuse call? ---No Guidelines Guideline Title Affirmed Question Affirmed Notes Nurse Date/Time Eilene Ghazi Time) COVID-19 - Vaccine Questions and Reactions COVID-19 vaccine, systemic reactions (e.g., fatigue, fever, muscle aches), questions about Wynetta Emery RN, [facility] 01/06/2020 8:41:52 PM Disp. Time Eilene Ghazi Time) Disposition Final User 01/06/2020 8:47:23 PM Home Care Yes Wynetta Emery, RN, Dawn PLEASE NOTE: All timestamps contained within this report are represented as [place] Standard Time. CONFIDENTIALTY NOTICE: This fax transmission is intended only for the addressee. It contains information that is legally privileged, confidential or otherwise protected from use or disclosure. If you are not the intended recipient, you are strictly prohibited from reviewing, disclosing, copying using or disseminating any of this information or taking any action in reliance on or regarding this information. If you have received this fax in error, please notify us immediately by telephone so that we can arrange for its return to [place]. Phone: [phone], Toll-Free: [phone], Fax: [phone] Page: 2 of 2 Call Id: [id number] Caller Disagree/Comply Comply Caller Understands Yes PreDisposition Call Doctor Care Advice Given Per Guideline HOME CARE: COVID-19 VACCINE - COMMON  REACTIONS: * Feeling tired (fatigue) * Fever and chills * Headache * Muscle aches or joint pains * Symptoms usually last 1 to 2 days. * Treat fevers above 101 F (38.3 C). The goal of fever therapy is to bring the fever down to a comfortable level. Remember that fever medicine usually lowers fever 2 degrees F (1 - 1 1/2 degrees C). CALL BACK IF: * Fever lasts over 3 days * You become worse CARE ADVICE given per COVID-19 - Vaccine Questions and Reactions (Adult) guideline. Comments User: Edwena Blow, RN Date/Time Eilene Ghazi Time): 01/06/2020 8:51:02 PM while on the phone caller rechecked PO2 93% , HR 82

## 2020-01-10 DIAGNOSIS — N390 Urinary tract infection, site not specified: Secondary | ICD-10-CM | POA: Diagnosis not present

## 2020-01-11 LAB — CBC AND DIFFERENTIAL
HCT: 39 — AB (ref 41–53)
Hemoglobin: 12.9 — AB (ref 13.5–17.5)
Neutrophils Absolute: 7.2
Platelets: 137 — AB (ref 150–399)
WBC: 9

## 2020-01-11 LAB — CBC: RBC: 4.04 (ref 3.87–5.11)

## 2020-01-12 ENCOUNTER — Telehealth: Payer: Self-pay

## 2020-01-12 DIAGNOSIS — N390 Urinary tract infection, site not specified: Secondary | ICD-10-CM | POA: Diagnosis not present

## 2020-01-12 NOTE — Telephone Encounter (Signed)
Nurse Assessment Nurse: Radford Pax, RN, Eugene Garnet Date/Time Eilene Ghazi Time): 01/10/2020 11:06:34 AM Confirm and document reason for call. If symptomatic, describe symptoms. ---Caller states her husband got a covid booster shot one week ago. has continued to have a temp of 100.1 periodically, very fatigued and weak. Upon triage questioning patient states he has been having urinary frequency, pain with urination, and some back pain. Does the patient have any new or worsening symptoms? ---Yes Will a triage be completed? ---Yes Related visit to physician within the last 2 weeks? ---Yes Does the PT have any chronic conditions? (i.e. diabetes, asthma, this includes High risk factors for pregnancy, etc.) ---No Is this a behavioral health or substance abuse call? ---No Guidelines Guideline Title Affirmed Question Affirmed Notes Nurse Date/Time (Eastern Time) Urination Pain - Male All other males with painful urination Turner, RN, Rebekah 01/10/2020 11:13:18 AM Disp. Time Eilene Ghazi Time) Disposition Final User 01/10/2020 11:25:23 AM See PCP within 24 Hours Yes Turner, RN, Sharion Settler Disagree/Comply Comply PLEASE NOTE: All timestamps contained within this report are represented as [place] Standard Time. CONFIDENTIALTY NOTICE: This fax transmission is intended only for the addressee. It contains information that is legally privileged, confidential or otherwise protected from use or disclosure. If you are not the intended recipient, you are strictly prohibited from reviewing, disclosing, copying using or disseminating any of this information or taking any action in reliance on or regarding this information. If you have received this fax in error, please notify us immediately by telephone so that we can arrange for its return to [place]. Phone: [phone], Toll-Free: [phone], Fax: [phone] Page: 2 of 2 Call Id: [id number] [place] Understands Yes PreDisposition Call Doctor Care Advice Given Per  Guideline SEE PCP WITHIN 24 HOURS: * IF OFFICE WILL BE CLOSED: You need to be seen within the next 24 hours. A clinic or an urgent care center is often a good source of care if your doctor's office is closed or you can't get an appointment. REASSURANCE AND EDUCATION: * Any man with painful urination needs to be checked. * It could be a urinary tract infection. Sometimes the urine is normal and the pain is caused by an infection of the penis or testicle. DRINK EXTRA FLUIDS: * Drink extra fluids. PAIN MEDICINES: * For pain relief, you can take either acetaminophen, ibuprofen, or naproxen. * They are over-the-counter (OTC) pain drugs. You can buy them at the drugstore. * ACETAMINOPHEN - EXTRA STRENGTH TYLENOL: Take 1,000 mg (two 500 mg pills) every 8 hours as needed. Each Extra Strength Tylenol pill has 500 mg of acetaminophen. The most you should take each day is 3,000 mg (6 pills a day). * ACETAMINOPHEN - REGULAR STRENGTH TYLENOL: Take 650 mg (two 325 mg pills) by mouth every 4 to 6 hours as needed. Each Regular Strength Tylenol pill has 325 mg of acetaminophen. The most you should take each day is 3,250 mg (10 pills a day). * IBUPROFEN (E.G., MOTRIN, ADVIL): Take 400 mg (two 200 mg pills) by mouth every 6 hours. The most you should take each day is 1,200 mg (six 200 mg pills), unless your doctor has told you to take more. CALL BACK IF: * Fever over 100.4 F (38.0 C) occurs * Side (flank) or lower back pain occurs * You become worse CARE ADVICE given per Urination Pain - Male (Adult) guideline. Comments User: Susie Cassette, RN Date/Time Eilene Ghazi Time): 01/10/2020 11:15:46 AM temp 98.0 Referrals GO TO FACILITY UNDECIDE

## 2020-01-12 NOTE — Telephone Encounter (Signed)
Pt has ov on 11/11.

## 2020-01-16 ENCOUNTER — Telehealth: Payer: Self-pay | Admitting: *Deleted

## 2020-01-16 NOTE — Telephone Encounter (Signed)
Patient wife called  Patient was at [facility] with pain with urination  Dx:UTI with hematuria. Symptoms x 1 week and 1/2 Taking Cipro  Blood work done at UC overall normal,  Showing  patient is anemic, per patient wife  Requesting appointment with PCP. PT was Schedule for Monday

## 2020-01-19 ENCOUNTER — Encounter: Payer: Self-pay | Admitting: Family Medicine

## 2020-01-19 ENCOUNTER — Other Ambulatory Visit: Payer: Self-pay

## 2020-01-19 ENCOUNTER — Ambulatory Visit (INDEPENDENT_AMBULATORY_CARE_PROVIDER_SITE_OTHER): Payer: Medicare Other | Admitting: Family Medicine

## 2020-01-19 VITALS — BP 170/71 | HR 64 | Temp 98.3°F | Ht 70.0 in | Wt 179.0 lb

## 2020-01-19 DIAGNOSIS — M1990 Unspecified osteoarthritis, unspecified site: Secondary | ICD-10-CM

## 2020-01-19 DIAGNOSIS — N390 Urinary tract infection, site not specified: Secondary | ICD-10-CM

## 2020-01-19 DIAGNOSIS — R030 Elevated blood-pressure reading, without diagnosis of hypertension: Secondary | ICD-10-CM | POA: Diagnosis not present

## 2020-01-19 NOTE — Assessment & Plan Note (Signed)
Home readings have been at goal.  Will continue home reading 150/90 or lower per JNC 8 guidelines.

## 2020-01-19 NOTE — Patient Instructions (Signed)
It was very nice to see you today!  We will check a urine sample today.  Please try taking 1 dose of MiraLAX daily as needed to have a soft bowel movement.  You can also try taking probiotics.  Yogurt or any other fermented foods have plenty of this.  Keep and eye on your BP and let me know if persistently 150/90 or higher.   Take care, Dr Jerline Pain  Please try these tips to maintain a healthy lifestyle:   Eat at least 3 REAL meals and 1-2 snacks per day.  Aim for no more than 5 hours between eating.  If you eat breakfast, please do so within one hour of getting up.    Each meal should contain half fruits/vegetables, one quarter protein, and one quarter carbs (no bigger than a computer mouse)   Cut down on sweet beverages. This includes juice, soda, and sweet tea.     Drink at least 1 glass of water with each meal and aim for at least 8 glasses per day   Exercise at least 150 minutes every week.

## 2020-01-19 NOTE — Assessment & Plan Note (Signed)
Overall stable.  Continue over-the-counter anti-inflammatories as needed.

## 2020-01-19 NOTE — Progress Notes (Signed)
   Terrence Hinton is a 86 y.o. male who presents today for an office visit.  Assessment/Plan:  New/Acute Problems: UTI No longer having symptoms-okay to stay off Cipro.  Will check culture to confirm eradication as patient does not have results of culture from urgent care.  Discussed reasons to return to care.  Constipation Recommended MiraLAX as needed.  He can also take probiotics.  Recommended good oral hydration.  Chronic Problems Addressed Today: Osteoarthritis Overall stable.  Continue over-the-counter anti-inflammatories as needed.  Elevated BP without diagnosis of hypertension Home readings have been at goal.  Will continue home reading 150/90 or lower per JNC 8 guidelines.     Subjective:  HPI:  Patient here for UTI follow-up.  Symptoms started about a week ago after receiving Covid booster.  To the point urgent care.  Started on Cipro.  Symptoms improved regarding UTI symptoms however developed some lower back pain and body aches.  He is finished his course of Cipro.  Do not have a urine specimen check today.  No fevers or chills.  He has had some ongoing constipation.  No more issues with back pain.  He checked his blood pressure at home and usually in the normal range.  He will occasionally take Aleve for arthritis which helps.       Objective:  Physical Exam: BP (!) 170/71   Pulse 64   Temp 98.3 F (36.8 C) (Temporal)   Ht 5\' 10"  (1.778 m)   Wt 179 lb (81.2 kg)   SpO2 96%   BMI 25.68 kg/m   Gen: No acute distress, resting comfortably Neuro: Grossly normal, moves all extremities Psych: Normal affect and thought content      Sal Spratley M. Jerline Pain, MD 01/19/2020 4:07 PM

## 2020-01-20 LAB — URINE CULTURE
MICRO NUMBER:: 11173440
Result:: NO GROWTH
SPECIMEN QUALITY:: ADEQUATE

## 2020-01-21 NOTE — Progress Notes (Signed)
Please inform patient of the following:  Urine culture is negative for UTI.  Algis Greenhouse. Jerline Pain, MD 01/21/2020 8:43 AM

## 2020-01-22 ENCOUNTER — Ambulatory Visit: Payer: Medicare Other | Admitting: Family Medicine

## 2020-01-29 ENCOUNTER — Telehealth: Payer: Self-pay

## 2020-01-29 NOTE — Telephone Encounter (Signed)
Patient's wife is calling in stating that Randon is still not feeling better and when going to urgent care they noticed that his platelets were low. Tried offering an appointment but states she would like to talk to the nurse.

## 2020-01-30 ENCOUNTER — Ambulatory Visit (INDEPENDENT_AMBULATORY_CARE_PROVIDER_SITE_OTHER): Payer: Medicare Other | Admitting: Physician Assistant

## 2020-01-30 ENCOUNTER — Ambulatory Visit (INDEPENDENT_AMBULATORY_CARE_PROVIDER_SITE_OTHER)
Admission: RE | Admit: 2020-01-30 | Discharge: 2020-01-30 | Disposition: A | Discharge status: Home / Self Care | Payer: Medicare Other | Source: Ambulatory Visit | Attending: Physician Assistant | Admitting: Physician Assistant

## 2020-01-30 ENCOUNTER — Other Ambulatory Visit: Payer: Self-pay

## 2020-01-30 ENCOUNTER — Encounter: Payer: Self-pay | Admitting: Physician Assistant

## 2020-01-30 ENCOUNTER — Encounter: Payer: Self-pay | Admitting: Family Medicine

## 2020-01-30 VITALS — BP 164/58 | HR 58 | Temp 98.1°F | Ht 70.0 in | Wt 181.0 lb

## 2020-01-30 DIAGNOSIS — N3944 Nocturnal enuresis: Secondary | ICD-10-CM | POA: Diagnosis not present

## 2020-01-30 DIAGNOSIS — R972 Elevated prostate specific antigen [PSA]: Secondary | ICD-10-CM

## 2020-01-30 DIAGNOSIS — M545 Low back pain, unspecified: Secondary | ICD-10-CM

## 2020-01-30 DIAGNOSIS — R5383 Other fatigue: Secondary | ICD-10-CM

## 2020-01-30 DIAGNOSIS — R710 Precipitous drop in hematocrit: Secondary | ICD-10-CM | POA: Diagnosis not present

## 2020-01-30 NOTE — Telephone Encounter (Signed)
Pt schedule appointment for today

## 2020-01-30 NOTE — Patient Instructions (Signed)
It was great to see you!  We will update your blood work today.  An order for an xray has been put in for you. To get your xray, you can walk in at the [facility] location without a scheduled appointment.  The address is [address]. It is across the street from [place] is located in the basement.  Hours of operation are M-F 8:30am to 5:00pm. Please note that they are closed for lunch between 12:30 and 1:00pm.  I am going to put in a referral for you to see urology.  Let us know if any worsening symptoms in the meantime.  Take care,  Inda Coke PA-C

## 2020-01-30 NOTE — Progress Notes (Addendum)
Terrence Hinton is a 86 y.o. male here for a new problem.  I acted as a Education administrator for [employer], PA-C Anselmo Pickler, LPN   History of Present Illness:   Chief Complaint  Patient presents with  . Fatigue  . Anemia    had lab test done at [facility],     HPI  Fatigue COVID booster on 01/03/20. Has had ongoing fatigue since that time. Normally has great stamina and is concerned because of feeling of fatigue throughout the day, despite adequate rest, hydration, nutrition. Denies: chest pain, SOB, lightheadedness, dizziness.  He has labwork with him today from recent urgent care visit on 01/11/20 that showed a decreased Hgb of 12.9 and Hct of 39. He is concerned about this. Denies rectal bleeding or hematuria, unintentional weight loss.  Wt Readings from Last 4 Encounters:  01/30/20 181 lb (82.1 kg)  01/19/20 179 lb (81.2 kg)  12/26/18 184 lb 4 oz (83.6 kg)  10/17/17 183 lb 9.6 oz (83.3 kg)     Lumbar pain Chronic issue. But feels like overall he has had more significant symptoms. No bone pain and pain does not radiate. Symptoms have worsened since COVID booster.  Urinary incontinence Reportedly had UTI symptoms after COVID booster, was treated with cipro and then had follow-up with PCP on 01/19/20 with normal UA. However, for the past 4-5 nights he has had nocturnal urinary incontinence. Denies hematuria or stool incontinence. Denies saddle anesthesia, fever, chills, abdominal pain, pressure/pain near scrotum/rectum, diarrhea. Had some constipation that has resolved.  Hx of elevated PSA About 14-15 years ago had urology evaluation in [place] due to elevated PSA. Had work-up done (he does not remember what this entails) and was told that all of his work-up was normal. Since that time he reports that his PSA has been overall stable.  Lab Results  Component Value Date   PSA 4.19 (H) 01/16/2019   PSA 8.60 (H) 10/17/2017      History reviewed. No pertinent past medical history.    Social History   Tobacco Use  . Smoking status: Former Smoker    Quit date: 10/17/1977    Years since quitting: 42.3  . Smokeless tobacco: Never Used  Substance Use Topics  . Alcohol use: Not Currently  . Drug use: Never    Past Surgical History:  Procedure Laterality Date  . CATARACT EXTRACTION    . TONSILLECTOMY  1945    Family History  Problem Relation Age of Onset  . Heart attack Mother   . Cancer Father   . Early death Father     No Known Allergies  Current Medications:   Current Outpatient Medications:  .  glucosamine-chondroitin 500-400 MG tablet, Take 1 tablet by mouth 3 (three) times daily., Disp: , Rfl:  .  Multiple Vitamin (MULTIVITAMIN) capsule, Take 1 capsule by mouth daily., Disp: , Rfl:  .  Omega-3 Fatty Acids (FISH OIL) 1000 MG CAPS, Take by mouth., Disp: , Rfl:    Review of Systems:   ROS  Negative unless otherwise specified per HPI.  Vitals:   Vitals:   01/30/20 1051  BP: (!) 164/58  Pulse: (!) 58  Temp: 98.1 F (36.7 C)  TempSrc: Temporal  SpO2: 98%  Weight: 181 lb (82.1 kg)  Height: 5\' 10"  (1.778 m)     Body mass index is 25.97 kg/m.  Physical Exam:   Physical Exam Vitals and nursing note reviewed.  Constitutional:      General: He is not in acute distress.  Appearance: He is well-developed. He is not ill-appearing or toxic-appearing.  Cardiovascular:     Rate and Rhythm: Normal rate and regular rhythm.     Pulses: Normal pulses.     Heart sounds: Normal heart sounds, S1 normal and S2 normal.     Comments: No LE edema Pulmonary:     Effort: Pulmonary effort is normal.     Breath sounds: Normal breath sounds.  Musculoskeletal:     Comments: No decreased ROM 2/2 pain with flexion/extension, lateral side bends, or rotation. Reproducible tenderness with deep palpation to bilateral paraspinal lumbar muscles. No bony tenderness.    Skin:    General: Skin is warm and dry.  Neurological:     Mental Status: He is alert.      GCS: GCS eye subscore is 4. GCS verbal subscore is 5. GCS motor subscore is 6.  Psychiatric:        Speech: Speech normal.        Behavior: Behavior normal. Behavior is cooperative.     Assessment and Plan:   Deuntae was seen today for fatigue and anemia.  Diagnoses and all orders for this visit:  Fatigue, unspecified type Likely multifactorial, possibly related to booster vaccine. Will update labs to reassess hgb, as well as additional anemia labs Update TSH today No systemic symptoms Follow-up based on results and clinical symptoms. Close follow-up with PCP -     TSH; Future -     TSH  Decreased hemoglobin Update  labs to reassess hgb, as well as additional anemia labs Intervention/recommendations based upon results -     CBC with Differential/Platelet; Future -     Comprehensive metabolic panel; Future -     Ferritin; Future -     Iron; Future -     Iron -     Ferritin -     Comprehensive metabolic panel -     CBC with Differential/Platelet  Lumbar pain No bony tenderness Due to chronicity, new worsening will update xray to r/o bony lesion or acute concerning findings. If negative, will recommend PT. -     Urinalysis, Routine w reflex microscopic; Future -     Urine Culture -     Urinalysis, Routine w reflex microscopic  Nocturnal enuresis Referral to urology. Update urine and culture again today. -     Urinalysis, Routine w reflex microscopic; Future -     DG Lumbar Spine Complete; Future -     Urine Culture -     DG Lumbar Spine Complete -     Urinalysis, Routine w reflex microscopic -     Ambulatory referral to Urology  Elevated PSA -     Ambulatory referral to Urology  CMA or LPN served as scribe during this visit. History, Physical, and Plan performed by medical provider. The above documentation has been reviewed and is accurate and complete.  Time spent with patient today was 25 minutes which consisted of chart review, discussing diagnosis, work up,  treatment answering questions and documentation.  Inda Coke, PA-C

## 2020-01-31 LAB — CBC WITH DIFFERENTIAL/PLATELET
Absolute Monocytes: 506 cells/uL (ref 200–950)
Basophils Absolute: 73 cells/uL (ref 0–200)
Basophils Relative: 1.2 %
Eosinophils Absolute: 153 cells/uL (ref 15–500)
Eosinophils Relative: 2.5 %
HCT: 33.4 % — ABNORMAL LOW (ref 38.5–50.0)
Hemoglobin: 11 g/dL — ABNORMAL LOW (ref 13.2–17.1)
Lymphs Abs: 1281 cells/uL (ref 850–3900)
MCH: 31.9 pg (ref 27.0–33.0)
MCHC: 32.9 g/dL (ref 32.0–36.0)
MCV: 96.8 fL (ref 80.0–100.0)
MPV: 10.3 fL (ref 7.5–12.5)
Monocytes Relative: 8.3 %
Neutro Abs: 4087 cells/uL (ref 1500–7800)
Neutrophils Relative %: 67 %
Platelets: 173 10*3/uL (ref 140–400)
RBC: 3.45 10*6/uL — ABNORMAL LOW (ref 4.20–5.80)
RDW: 12.7 % (ref 11.0–15.0)
Total Lymphocyte: 21 %
WBC: 6.1 10*3/uL (ref 3.8–10.8)

## 2020-01-31 LAB — IRON: Iron: 69 ug/dL (ref 50–180)

## 2020-01-31 LAB — COMPREHENSIVE METABOLIC PANEL
AG Ratio: 1.5 (calc) (ref 1.0–2.5)
ALT: 15 U/L (ref 9–46)
AST: 15 U/L (ref 10–35)
Albumin: 3.8 g/dL (ref 3.6–5.1)
Alkaline phosphatase (APISO): 72 U/L (ref 35–144)
BUN/Creatinine Ratio: 24 (calc) — ABNORMAL HIGH (ref 6–22)
BUN: 52 mg/dL — ABNORMAL HIGH (ref 7–25)
CO2: 20 mmol/L (ref 20–32)
Calcium: 9 mg/dL (ref 8.6–10.3)
Chloride: 113 mmol/L — ABNORMAL HIGH (ref 98–110)
Creat: 2.19 mg/dL — ABNORMAL HIGH (ref 0.70–1.11)
Globulin: 2.6 g/dL (calc) (ref 1.9–3.7)
Glucose, Bld: 102 mg/dL — ABNORMAL HIGH (ref 65–99)
Potassium: 5 mmol/L (ref 3.5–5.3)
Sodium: 145 mmol/L (ref 135–146)
Total Bilirubin: 0.3 mg/dL (ref 0.2–1.2)
Total Protein: 6.4 g/dL (ref 6.1–8.1)

## 2020-01-31 LAB — URINALYSIS, ROUTINE W REFLEX MICROSCOPIC
Bilirubin Urine: NEGATIVE
Glucose, UA: NEGATIVE
Hgb urine dipstick: NEGATIVE
Ketones, ur: NEGATIVE
Leukocytes,Ua: NEGATIVE
Nitrite: NEGATIVE
Protein, ur: NEGATIVE
Specific Gravity, Urine: 1.014 (ref 1.001–1.03)
pH: <=5 (ref 5.0–8.0)

## 2020-01-31 LAB — URINE CULTURE
MICRO NUMBER:: 11226803
Result:: NO GROWTH
SPECIMEN QUALITY:: ADEQUATE

## 2020-01-31 LAB — FERRITIN: Ferritin: 335 ng/mL (ref 24–380)

## 2020-01-31 LAB — TSH: TSH: 2.88 mIU/L (ref 0.40–4.50)

## 2020-02-02 ENCOUNTER — Other Ambulatory Visit: Payer: Medicare Other

## 2020-02-02 ENCOUNTER — Other Ambulatory Visit: Payer: Self-pay | Admitting: Physician Assistant

## 2020-02-02 ENCOUNTER — Telehealth: Payer: Self-pay

## 2020-02-02 DIAGNOSIS — R6889 Other general symptoms and signs: Secondary | ICD-10-CM

## 2020-02-02 DIAGNOSIS — R710 Precipitous drop in hematocrit: Secondary | ICD-10-CM

## 2020-02-02 DIAGNOSIS — R7989 Other specified abnormal findings of blood chemistry: Secondary | ICD-10-CM | POA: Diagnosis not present

## 2020-02-02 NOTE — Telephone Encounter (Signed)
See result notes. 

## 2020-02-02 NOTE — Telephone Encounter (Signed)
Patient is requesting call back in regard to labs that was taken on 11/19, ordered by Inda Coke.

## 2020-02-03 ENCOUNTER — Telehealth: Payer: Self-pay

## 2020-02-03 ENCOUNTER — Other Ambulatory Visit: Payer: Self-pay | Admitting: Family Medicine

## 2020-02-03 ENCOUNTER — Emergency Department (HOSPITAL_COMMUNITY)
Admission: EM | Admit: 2020-02-03 | Discharge: 2020-02-03 | Disposition: A | Discharge status: Home / Self Care | Payer: Medicare Other | Attending: Emergency Medicine | Admitting: Emergency Medicine

## 2020-02-03 ENCOUNTER — Encounter (HOSPITAL_COMMUNITY): Payer: Self-pay

## 2020-02-03 ENCOUNTER — Emergency Department (HOSPITAL_COMMUNITY): Payer: Medicare Other

## 2020-02-03 ENCOUNTER — Other Ambulatory Visit: Payer: Self-pay

## 2020-02-03 DIAGNOSIS — M160 Bilateral primary osteoarthritis of hip: Secondary | ICD-10-CM | POA: Diagnosis not present

## 2020-02-03 DIAGNOSIS — N400 Enlarged prostate without lower urinary tract symptoms: Secondary | ICD-10-CM | POA: Diagnosis not present

## 2020-02-03 DIAGNOSIS — R339 Retention of urine, unspecified: Secondary | ICD-10-CM | POA: Diagnosis not present

## 2020-02-03 DIAGNOSIS — N179 Acute kidney failure, unspecified: Secondary | ICD-10-CM | POA: Diagnosis not present

## 2020-02-03 DIAGNOSIS — R7989 Other specified abnormal findings of blood chemistry: Secondary | ICD-10-CM | POA: Diagnosis not present

## 2020-02-03 DIAGNOSIS — R944 Abnormal results of kidney function studies: Secondary | ICD-10-CM | POA: Insufficient documentation

## 2020-02-03 DIAGNOSIS — N1330 Unspecified hydronephrosis: Secondary | ICD-10-CM | POA: Diagnosis not present

## 2020-02-03 DIAGNOSIS — Z87891 Personal history of nicotine dependence: Secondary | ICD-10-CM | POA: Diagnosis not present

## 2020-02-03 DIAGNOSIS — Z87440 Personal history of urinary (tract) infections: Secondary | ICD-10-CM | POA: Insufficient documentation

## 2020-02-03 DIAGNOSIS — I700 Atherosclerosis of aorta: Secondary | ICD-10-CM | POA: Diagnosis not present

## 2020-02-03 DIAGNOSIS — Z20822 Contact with and (suspected) exposure to covid-19: Secondary | ICD-10-CM | POA: Insufficient documentation

## 2020-02-03 DIAGNOSIS — R350 Frequency of micturition: Secondary | ICD-10-CM | POA: Diagnosis present

## 2020-02-03 DIAGNOSIS — R710 Precipitous drop in hematocrit: Secondary | ICD-10-CM | POA: Diagnosis not present

## 2020-02-03 LAB — BASIC METABOLIC PANEL
Anion gap: 9 (ref 5–15)
BUN/Creatinine Ratio: 18 (calc) (ref 6–22)
BUN: 47 mg/dL — ABNORMAL HIGH (ref 7–25)
BUN: 52 mg/dL — ABNORMAL HIGH (ref 8–23)
CO2: 21 mmol/L (ref 20–32)
CO2: 21 mmol/L — ABNORMAL LOW (ref 22–32)
Calcium: 9.1 mg/dL (ref 8.6–10.3)
Calcium: 8.7 mg/dL — ABNORMAL LOW (ref 8.9–10.3)
Chloride: 113 mmol/L — ABNORMAL HIGH (ref 98–110)
Chloride: 114 mmol/L — ABNORMAL HIGH (ref 98–111)
Creat: 2.65 mg/dL — ABNORMAL HIGH (ref 0.70–1.11)
Creatinine, Ser: 2.57 mg/dL — ABNORMAL HIGH (ref 0.61–1.24)
GFR, Estimated: 24 mL/min — ABNORMAL LOW (ref >60)
Glucose, Bld: 128 mg/dL — ABNORMAL HIGH (ref 65–99)
Glucose, Bld: 116 mg/dL — ABNORMAL HIGH (ref 70–99)
Potassium: 4.6 mmol/L (ref 3.5–5.3)
Potassium: 4.5 mmol/L (ref 3.5–5.1)
Sodium: 146 mmol/L (ref 135–146)
Sodium: 144 mmol/L (ref 135–145)

## 2020-02-03 LAB — B12 AND FOLATE PANEL
Folate: >24 ng/mL
Vitamin B-12: 521 pg/mL (ref 200–1100)

## 2020-02-03 LAB — CBC WITH DIFFERENTIAL/PLATELET
Abs Immature Granulocytes: 0.02 10*3/uL (ref 0.00–0.07)
Absolute Monocytes: 476 cells/uL (ref 200–950)
Basophils Absolute: 41 cells/uL (ref 0–200)
Basophils Absolute: 0 10*3/uL (ref 0.0–0.1)
Basophils Relative: 0.6 %
Basophils Relative: 0 %
Eosinophils Absolute: 110 cells/uL (ref 15–500)
Eosinophils Absolute: 0.1 10*3/uL (ref 0.0–0.5)
Eosinophils Relative: 1.6 %
Eosinophils Relative: 1 %
HCT: 35.4 % — ABNORMAL LOW (ref 38.5–50.0)
HCT: 32.9 % — ABNORMAL LOW (ref 39.0–52.0)
Hemoglobin: 11.7 g/dL — ABNORMAL LOW (ref 13.2–17.1)
Hemoglobin: 10.9 g/dL — ABNORMAL LOW (ref 13.0–17.0)
Immature Granulocytes: 0 %
Lymphocytes Relative: 14 %
Lymphs Abs: 1097 cells/uL (ref 850–3900)
Lymphs Abs: 1 10*3/uL (ref 0.7–4.0)
MCH: 31.7 pg (ref 27.0–33.0)
MCH: 32.3 pg (ref 26.0–34.0)
MCHC: 33.1 g/dL (ref 32.0–36.0)
MCHC: 33.1 g/dL (ref 30.0–36.0)
MCV: 95.9 fL (ref 80.0–100.0)
MCV: 97.6 fL (ref 80.0–100.0)
MPV: 10.4 fL (ref 7.5–12.5)
Monocytes Absolute: 0.6 10*3/uL (ref 0.1–1.0)
Monocytes Relative: 6.9 %
Monocytes Relative: 8 %
Neutro Abs: 5175 cells/uL (ref 1500–7800)
Neutro Abs: 5.4 10*3/uL (ref 1.7–7.7)
Neutrophils Relative %: 75 %
Neutrophils Relative %: 77 %
Platelets: 157 10*3/uL (ref 140–400)
Platelets: 138 10*3/uL — ABNORMAL LOW (ref 150–400)
RBC: 3.69 10*6/uL — ABNORMAL LOW (ref 4.20–5.80)
RBC: 3.37 MIL/uL — ABNORMAL LOW (ref 4.22–5.81)
RDW: 12.8 % (ref 11.0–15.0)
RDW: 13.1 % (ref 11.5–15.5)
Total Lymphocyte: 15.9 %
WBC: 6.9 10*3/uL (ref 3.8–10.8)
WBC: 7.2 10*3/uL (ref 4.0–10.5)
nRBC: 0 % (ref 0.0–0.2)

## 2020-02-03 LAB — URINALYSIS, ROUTINE W REFLEX MICROSCOPIC
Bilirubin Urine: NEGATIVE
Glucose, UA: NEGATIVE mg/dL
Hgb urine dipstick: NEGATIVE
Ketones, ur: NEGATIVE mg/dL
Leukocytes,Ua: NEGATIVE
Nitrite: NEGATIVE
Protein, ur: NEGATIVE mg/dL
Specific Gravity, Urine: 1.009 (ref 1.005–1.030)
pH: 5 (ref 5.0–8.0)

## 2020-02-03 LAB — RESP PANEL BY RT-PCR (FLU A&B, COVID) ARPGX2
Influenza A by PCR: NEGATIVE
Influenza B by PCR: NEGATIVE
SARS Coronavirus 2 by RT PCR: NEGATIVE

## 2020-02-03 MED ORDER — SODIUM CHLORIDE 0.9 % IV BOLUS
1000.0000 mL | Freq: Once | INTRAVENOUS | Status: AC
  Administered 2020-02-03: 1000 mL via INTRAVENOUS

## 2020-02-03 NOTE — ED Notes (Signed)
Foley cath in place at time of D/C, pt instructed on foley care, pt able to demonstrate how to use and care for foley cath, pt denies any questions or complaints at this time. Pt states wife at home to help with care.

## 2020-02-03 NOTE — Telephone Encounter (Signed)
Patient is calling in to schedule an ED follow up advised to follow up in one day, Dr.Parker is full anyway we can overbook or can patient wait until Monday?

## 2020-02-03 NOTE — ED Notes (Signed)
Bladder scan showed >565mL post void.

## 2020-02-03 NOTE — ED Notes (Signed)
Review D/C papers with pt, pt states understanding, pt denies questions at this time.

## 2020-02-03 NOTE — Telephone Encounter (Signed)
See below

## 2020-02-03 NOTE — ED Notes (Signed)
I called patient for triage and he was in the restroom

## 2020-02-03 NOTE — ED Notes (Signed)
Pt walked to restroom without assistance. Pt provided UA. UA is at bedside awaiting order.

## 2020-02-03 NOTE — ED Triage Notes (Signed)
Pt presents with c/o urinary frequency. Pt reports he had labs drawn at his MD's office and was told to come in as his kidneys are not functioning properly.

## 2020-02-03 NOTE — Discharge Instructions (Addendum)
Suspect that your elevated creatinine is secondary to urinary retention from your enlarged prostate as discussed.  Now that you have a Foley catheter suspect that your kidney injury will slowly improve.  Please follow-up with your primary care doctor to continue to follow along your creatinine/kidney function.  Follow-up with urology as well to discuss removing Foley.  Please return to the ED if symptoms worsen.

## 2020-02-03 NOTE — ED Provider Notes (Signed)
[facility] DEPT Provider Note   CSN: [id number] Arrival date & time: 02/03/20  1119     History Chief Complaint  Patient presents with  . Urinary Frequency  . Abnormal Lab    Terrence Hinton is a 86 y.o. male.  Patient sent here from [place] office due to elevated creatinine.  Patient has had increased urinary frequency since UTI from last week.  Denies any numbness or tingling in his legs.  No back pain.  No flank pain.  No history of kidney stones.  No history of BPH.  No history of urinary retention.  The history is provided by the patient.  Urinary Frequency This is a new problem. The current episode started more than 1 week ago. The problem occurs daily. The problem has not changed since onset.Pertinent negatives include no chest pain, no abdominal pain and no shortness of breath. Nothing aggravates the symptoms. Nothing relieves the symptoms. He has tried nothing for the symptoms. The treatment provided no relief.       History reviewed. No pertinent past medical history.  Patient Active Problem List   Diagnosis Date Noted  . Osteoarthritis 12/26/2018  . Elevated BP without diagnosis of hypertension 10/17/2017    Past Surgical History:  Procedure Laterality Date  . CATARACT EXTRACTION    . TONSILLECTOMY  1945       Family History  Problem Relation Age of Onset  . Heart attack Mother   . Cancer Father   . Early death Father     Social History   Tobacco Use  . Smoking status: Former Smoker    Quit date: 10/17/1977    Years since quitting: 42.3  . Smokeless tobacco: Never Used  Substance Use Topics  . Alcohol use: Not Currently  . Drug use: Never    Home Medications Prior to Admission medications   Medication Sig Start Date End Date Taking? Authorizing Provider  glucosamine-chondroitin 500-400 MG tablet Take 1 tablet by mouth 3 (three) times daily.    [provider]  Multiple Vitamin (MULTIVITAMIN) capsule Take 1  capsule by mouth daily.    [provider]  Omega-3 Fatty Acids (FISH OIL) 1000 MG CAPS Take by mouth.    [provider]    Allergies    Patient has no known allergies.  Review of Systems   Review of Systems  Constitutional: Negative for chills and fever.  HENT: Negative for ear pain and sore throat.   Eyes: Negative for pain and visual disturbance.  Respiratory: Negative for cough and shortness of breath.   Cardiovascular: Negative for chest pain and palpitations.  Gastrointestinal: Negative for abdominal pain and vomiting.  Genitourinary: Positive for difficulty urinating and frequency. Negative for decreased urine volume, discharge, dysuria, enuresis, flank pain, genital sores, hematuria, testicular pain and urgency.  Musculoskeletal: Negative for arthralgias and back pain.  Skin: Negative for color change and rash.  Neurological: Negative for seizures and syncope.  All other systems reviewed and are negative.   Physical Exam Updated Vital Signs BP (!) 167/78 (BP Location: Left Arm)   Pulse 66   Temp 98.4 F (36.9 C)   Resp 14   Ht 5\' 10"  (1.778 m)   Wt 82.1 kg   SpO2 99%   BMI 25.97 kg/m   Physical Exam Vitals and nursing note reviewed.  Constitutional:      General: He is not in acute distress.    Appearance: He is well-developed. He is not ill-appearing.  HENT:  Head: Normocephalic and atraumatic.     Nose: Nose normal.     Mouth/Throat:     Mouth: Mucous membranes are moist.  Eyes:     Conjunctiva/sclera: Conjunctivae normal.     Pupils: Pupils are equal, round, and reactive to light.  Cardiovascular:     Rate and Rhythm: Normal rate and regular rhythm.     Pulses: Normal pulses.     Heart sounds: Normal heart sounds. No murmur heard.   Pulmonary:     Effort: Pulmonary effort is normal. No respiratory distress.     Breath sounds: Normal breath sounds.  Abdominal:     General: There is no distension.     Palpations: Abdomen is  soft.     Tenderness: There is no abdominal tenderness.  Musculoskeletal:     Cervical back: Normal range of motion and neck supple.     Right lower leg: No edema.     Left lower leg: No edema.  Skin:    General: Skin is warm and dry.     Capillary Refill: Capillary refill takes less than 2 seconds.  Neurological:     General: No focal deficit present.     Mental Status: He is alert.  Psychiatric:        Mood and Affect: Mood normal.     ED Results / Procedures / Treatments   Labs (all labs ordered are listed, but only abnormal results are displayed) Labs Reviewed  CBC WITH DIFFERENTIAL/PLATELET - Abnormal; Notable for the following components:      Result Value   RBC 3.37 (*)    Hemoglobin 10.9 (*)    HCT 32.9 (*)    Platelets 138 (*)    All other components within normal limits  BASIC METABOLIC PANEL - Abnormal; Notable for the following components:   Chloride 114 (*)    CO2 21 (*)    Glucose, Bld 116 (*)    BUN 52 (*)    Creatinine, Ser 2.57 (*)    Calcium 8.7 (*)    GFR, Estimated 24 (*)    All other components within normal limits  RESP PANEL BY RT-PCR (FLU A&B, COVID) ARPGX2  URINE CULTURE  URINALYSIS, ROUTINE W REFLEX MICROSCOPIC    EKG None  Radiology CT Renal Stone Study  Result Date: 02/03/2020 CLINICAL DATA:  Flank pain, urinary retention EXAM: CT ABDOMEN AND PELVIS WITHOUT CONTRAST TECHNIQUE: Multidetector CT imaging of the abdomen and pelvis was performed following the standard protocol without IV contrast. COMPARISON:  None. FINDINGS: Lower chest: No acute abnormality. Hepatobiliary: No focal liver lesion. Minimal layering hyperdensity in the gallbladder may reflect sludge or tiny gallstones. No biliary dilatation. Pancreas: Unremarkable Spleen: Unremarkable. Adrenals/Urinary Tract: Bladder is partially decompressed by Foley catheter with related intraluminal air. Bladder wall thickening likely due to underdistention and chronic outlet obstruction. Right  greater than left hydroureteronephrosis. A calculus not identified. Adrenals are unremarkable. Stomach/Bowel: Stomach is within normal limits. Bowel is normal in caliber. Vascular/Lymphatic: Aortic atherosclerosis. No enlarged lymph nodes identified. Reproductive: Enlarged prostate. Other: No ascites.  Abdominal wall is unremarkable. Musculoskeletal: Degenerative changes of the included spine. Degenerative changes of the hips, advanced on the right. IMPRESSION: Right greater than left hydroureteronephrosis. No obstructing calculus. Bladder wall thickening likely due to underdistention and chronic outlet obstruction from prostate enlargement. Minimal gallbladder sludge or layering gallstones. Advanced right hip osteoarthritis. Aortic atherosclerosis. Electronically Signed   By: Macy Mis M.D.   On: 02/03/2020 14:06    Procedures Procedures (  including critical care time)  Medications Ordered in ED Medications  sodium chloride 0.9 % bolus 1,000 mL (0 mLs Intravenous Stopped 02/03/20 1400)    ED Course  I have reviewed the triage vital signs and the nursing notes.  Pertinent labs & imaging results that were available during my care of the patient were reviewed by me and considered in my medical decision making (see chart for details).    MDM Rules/Calculators/A&P                          Terrence Hinton is an 86 year old male with no significant medical history presents the ED with urinary frequency.  Overall unremarkable vitals.  No fever.  Diagnosed with a UTI last week and ever since has been having some urinary frequency and incomplete emptying.  Denies any severe back pain, loss of bowel or bladder.  No leg weakness.  No back pain.  Bladder scan showed over 500 cc of urine after patient voided.  A Foley catheter was placed and patient had over 1 L of urine out.  He was sent for evaluation due to increased creatinine at his doctor's office.  Which is around 2.5.  Overall I suspect that this AKI  is from functional bladder outlet obstruction from retention.  Could be in the setting of a UTI recently or from BPH which she has a history of.  Overall patient feels much improved after Foley catheter.  He does not have any flank pain.  Urinalysis negative for infection.  Otherwise electrolytes are unremarkable.  No significant anemia, leukocytosis.  CT renal study has been ordered to further evaluate for any type of obstructive process.  However suspect that creatinine will improve now that patient has Foley catheter.  We will have him follow-up with urology.  Recommend that he try to follow-up with primary care doctor to have a creatinine rechecked but do not believe he needs admission for any further work-up as urinary retention likely the cause of his AKI which should improve now that he has obstruction relieved.    CT scan shows bilateral hydroureteronephrosis.  No obstructing kidney stone.  Does have enlarged prostate.  Overall suspect that this was the cause of urinary retention.  Foley has been placed.  Patient doing well.  Suspect the this is the cause of AKI and will improve.  Given follow-up with urology and understands return precautions.  Recommend follow-up with primary care doctor to recheck creatinine while he is awaiting urology follow-up.  This chart was dictated using voice recognition software.  Despite best efforts to proofread,  errors can occur which can change the documentation meaning.   Final Clinical Impression(s) / ED Diagnoses Final diagnoses:  Urinary retention  AKI (acute kidney injury) [facility])    Rx / [place] Orders ED Discharge Orders    None       Lennice Sites, DO 02/03/20 1441

## 2020-02-04 ENCOUNTER — Other Ambulatory Visit: Payer: Self-pay | Admitting: Family Medicine

## 2020-02-04 ENCOUNTER — Encounter: Payer: Self-pay | Admitting: Family Medicine

## 2020-02-04 ENCOUNTER — Ambulatory Visit (INDEPENDENT_AMBULATORY_CARE_PROVIDER_SITE_OTHER): Payer: Medicare Other | Admitting: Family Medicine

## 2020-02-04 ENCOUNTER — Telehealth: Payer: Self-pay

## 2020-02-04 VITALS — BP 142/78 | HR 63 | Temp 99.0°F | Ht 70.0 in | Wt 175.0 lb

## 2020-02-04 DIAGNOSIS — N179 Acute kidney failure, unspecified: Secondary | ICD-10-CM

## 2020-02-04 DIAGNOSIS — R339 Retention of urine, unspecified: Secondary | ICD-10-CM

## 2020-02-04 DIAGNOSIS — Z978 Presence of other specified devices: Secondary | ICD-10-CM | POA: Diagnosis not present

## 2020-02-04 DIAGNOSIS — R310 Gross hematuria: Secondary | ICD-10-CM

## 2020-02-04 LAB — POCT URINALYSIS DIPSTICK
Bilirubin, UA: NEGATIVE
Glucose, UA: NEGATIVE
Ketones, UA: NEGATIVE
Nitrite, UA: NEGATIVE
Protein, UA: POSITIVE — AB
Spec Grav, UA: 1.025 (ref 1.010–1.025)
Urobilinogen, UA: 0.2 E.U./dL
pH, UA: 5 (ref 5.0–8.0)

## 2020-02-04 LAB — BASIC METABOLIC PANEL
BUN: 41 mg/dL — ABNORMAL HIGH (ref 6–23)
CO2: 26 mEq/L (ref 19–32)
Calcium: 8.7 mg/dL (ref 8.4–10.5)
Chloride: 108 mEq/L (ref 96–112)
Creatinine, Ser: 2.09 mg/dL — ABNORMAL HIGH (ref 0.40–1.50)
GFR: 28.25 mL/min — ABNORMAL LOW (ref >60.00)
Glucose, Bld: 95 mg/dL (ref 70–99)
Potassium: 4.4 mEq/L (ref 3.5–5.1)
Sodium: 144 mEq/L (ref 135–145)

## 2020-02-04 LAB — URINE CULTURE: Culture: NO GROWTH

## 2020-02-04 NOTE — Telephone Encounter (Signed)
Called patient and he stated he is scheduled for [facility] in [facility] today.

## 2020-02-04 NOTE — Progress Notes (Signed)
Chief Complaint  Patient presents with  . Hematuria    Subjective: Patient is a 86 y.o. male here for ED f/u. Here w his spouse.  Yesterday the patient was at the emergency department for acute urinary retention and AKI.  He had a Foley catheter placed and has noticed blood in his urine ever since.  He does not have any drainage, abdominal pain, fevers, or bowel changes.  He has an appointment with the urology team on December 9.  He was treated for UTI a couple weeks ago.  He did have resolution of his symptoms at that time.  No known health problems  Objective: BP (!) 142/78 (BP Location: Left Arm, Patient Position: Sitting, Cuff Size: Normal)   Pulse 63   Temp 99 F (37.2 C) (Oral)   Ht 5\' 10"  (1.778 m)   Wt 175 lb (79.4 kg)   SpO2 93%   BMI 25.11 kg/m  General: Awake, appears stated age HEENT: MMM, EOMi Heart: RRR, no murmurs Lungs: CTAB, no rales, wheezes or rhonchi. No accessory muscle use Psych: Age appropriate judgment and insight, normal affect and mood  Assessment and Plan: AKI (acute kidney injury) ([place]) - Plan: Basic metabolic panel  Urinary retention  Gross hematuria - Plan: POCT Urinalysis Dipstick  Foley catheter in place - Plan: POCT Urinalysis Dipstick  1.  Recheck creatinine today. 2.  Maintain Foley until he sees urology 3/4.  Doubt infection but will check urinalysis.  Hydration encouraged. Follow-up as originally scheduled with his normal PCP. The patient voiced understanding and agreement to the plan.  [place], DO 02/04/20  12:17 PM

## 2020-02-04 NOTE — Telephone Encounter (Signed)
Patient is scheduled at the [facility] in [facility].   Nurse Assessment Nurse: Markus Daft, RN, Sherre Poot Date/Time Eilene Ghazi Time): 02/04/2020 10:05:09 AM Confirm and document reason for call. If symptomatic, describe symptoms. ---Caller states her spouse was seen in ER yesterday given Foley Cather and now seeing blood in it. Bright red mixed with urine, no blood clots seen. Not on fluid restrictions. He is drinking water but not a lot. He started 3 wks ago with bad UTI. Given strong antibiotics, then f/u with PCP, and with each UA, his BUN was increasing. He was retaining urine. MRI done. Bladder was full, but he couldn't feel it. Suspect enlarged prostate or kidney problem. Sent to ER yesterday. Does the patient have any new or worsening symptoms? ---Yes Will a triage be completed? ---Yes Related visit to physician within the last 2 weeks? ---Yes Does the PT have any chronic conditions? (i.e. diabetes, asthma, this includes High risk factors for pregnancy, etc.) ---No Is this a behavioral health or substance abuse call? ---No Guidelines Guideline Title Affirmed Question Affirmed Notes Nurse Date/Time Eilene Ghazi Time) Urinary Catheter Symptoms and Questions [1] Bloody or red-colored urine AND [2] no recent prostate or bladder surgery (Exception: brief episode and urine now clear) Markus Daft, RN, [place] 02/04/2020 10:10:12 AM PLEASE NOTE: All timestamps contained within this report are represented as [place] Standard Time. CONFIDENTIALTY NOTICE: This fax transmission is intended only for the addressee. It contains information that is legally privileged, confidential or otherwise protected from use or disclosure. If you are not the intended recipient, you are strictly prohibited from reviewing, disclosing, copying using or disseminating any of this information or taking any action in reliance on or regarding this information. If you have received this fax in error, please notify us immediately by  telephone so that we can arrange for its return to [place]. Phone: [phone], Toll-Free: [phone], Fax: [phone] Page: 2 of 2 Call Id: [id number] Secretary. Time Eilene Ghazi Time) Disposition Final User 02/04/2020 9:45:46 AM Attempt made - message left Jackqulyn Livings 02/04/2020 10:21:01 AM Called On-Call Provider Markus Daft, RN, Sherre Poot Reason: Scheduling notified of pt outcome, and planning to have him seen at office today. 02/04/2020 10:16:02 AM See PCP within 24 Hours Yes Markus Daft, RN, Sherre Poot Caller Disagree/Comply Comply Caller Understands Yes PreDisposition Go to Urgent Care/Walk-In Clinic Care Advice Given Per Guideline SEE PCP WITHIN 24 HOURS: * IF OFFICE WILL BE OPEN: You need to be examined within the next 24 hours. Call your doctor (or NP/PA) when the office opens and make an appointment. INCREASE DAILY FLUID INTAKE: * Increase daily fluid intake. Drink 8-10 glasses of liquid each day. * The goal is to keep the urine clear or light-yellow in color. RECORD YOUR FLUID INTAKE AND URINE: * Keep a record of daily urine output and your fluid intake. [place] YOUR HANDS: * Wash your hands with soap and water (or a moist disposable towelette). * Wash before and after handling the catheter, tube, or drainage bag. KEEP DRAINAGE BAG BELOW BLADDER: * Keep the drainage bag below the level of the bladder at all times. * You want to make certain that urine flows down and into the drainage bag. You do not want urine flowing back up into bladder. CALL BACK IF: * No urine output for over 4 hours * Fever over 100.4 F (38.0 C) * Abdomen pain occurs * You become worse CARE ADVICE given per Urinary Catheter Symptoms and Questions (Adult) guideline. Comments User: Mayford Knife, RN Date/Time Eilene Ghazi Time): 02/04/2020 10:11:23 AM Noticed  blood yesterday right after getting home. It hurt while placing it. Referrals REFERRED TO PCP OFFICE Warm transfer to backlin

## 2020-02-04 NOTE — Telephone Encounter (Signed)
See below

## 2020-02-04 NOTE — Addendum Note (Signed)
Addended by: Kelle Darting A on: 02/04/2020 02:09 PM   Modules accepted: Orders

## 2020-02-04 NOTE — Telephone Encounter (Signed)
I am not able to overbook this morning. We can get him to another office or have him scheduled on Friday with afterhours clinic.  Algis Greenhouse. Jerline Pain, MD 02/04/2020 9:28 AM

## 2020-02-04 NOTE — Patient Instructions (Signed)
Stay hydrated, try to get around 55 oz of water daily. Consider Crystal Light or other water additive to help with flavor.  Give [place] 2-3 business days to get the results of your labs back.   Let us know if you need anything.

## 2020-02-05 LAB — URINE CULTURE
MICRO NUMBER:: 11244396
Result:: NO GROWTH
SPECIMEN QUALITY:: ADEQUATE

## 2020-02-10 ENCOUNTER — Ambulatory Visit (INDEPENDENT_AMBULATORY_CARE_PROVIDER_SITE_OTHER): Payer: Medicare Other | Admitting: Family Medicine

## 2020-02-10 ENCOUNTER — Encounter: Payer: Self-pay | Admitting: Family Medicine

## 2020-02-10 ENCOUNTER — Other Ambulatory Visit: Payer: Self-pay

## 2020-02-10 VITALS — BP 163/77 | HR 67 | Temp 99.0°F | Ht 70.0 in | Wt 174.2 lb

## 2020-02-10 DIAGNOSIS — R7989 Other specified abnormal findings of blood chemistry: Secondary | ICD-10-CM | POA: Diagnosis not present

## 2020-02-10 DIAGNOSIS — N400 Enlarged prostate without lower urinary tract symptoms: Secondary | ICD-10-CM | POA: Insufficient documentation

## 2020-02-10 DIAGNOSIS — R338 Other retention of urine: Secondary | ICD-10-CM | POA: Diagnosis not present

## 2020-02-10 DIAGNOSIS — N401 Enlarged prostate with lower urinary tract symptoms: Secondary | ICD-10-CM

## 2020-02-10 MED ORDER — METHYLPREDNISOLONE ACETATE 80 MG/ML IJ SUSP
80.0000 mg | Freq: Once | INTRAMUSCULAR | Status: DC
Start: 2020-02-10 — End: 2020-02-10

## 2020-02-10 NOTE — Addendum Note (Signed)
Addended by: Betti Cruz on: 02/10/2020 02:43 PM   Modules accepted: Orders

## 2020-02-10 NOTE — Assessment & Plan Note (Signed)
Foley currently in place.  Tolerating well.  He will be following up with urology next week.

## 2020-02-10 NOTE — Patient Instructions (Signed)
It was very nice to see you today!  Your issue is that you have a urine blockage due to your prostate.  We will check blood work today to ensure that everything is normalizing.  Please follow-up with the urologist soon.  Take care, Dr Jerline Pain  Please try these tips to maintain a healthy lifestyle:   Eat at least 3 REAL meals and 1-2 snacks per day.  Aim for no more than 5 hours between eating.  If you eat breakfast, please do so within one hour of getting up.    Each meal should contain half fruits/vegetables, one quarter protein, and one quarter carbs (no bigger than a computer mouse)   Cut down on sweet beverages. This includes juice, soda, and sweet tea.     Drink at least 1 glass of water with each meal and aim for at least 8 glasses per day   Exercise at least 150 minutes every week.

## 2020-02-10 NOTE — Progress Notes (Signed)
   Terrence Hinton is a 86 y.o. male who presents today for an office visit.  Assessment/Plan:  New/Acute Problems: AKI Secondary to BPH and obstructive uropathy.  Will check CBC in [facility] today.  Dissipate full recovery.  Terrence Hinton will follow-up with urology next week.  May benefit from trial of Flomax but will defer to urology.   Chronic Problems Addressed Today: BPH (benign prostatic hyperplasia) Foley currently in place.  Tolerating well.  Terrence Hinton will be following up with urology next week.    Subjective:  HPI:  Terrence Hinton here for follow-up.  Was seen about 3 weeks ago for UTI follow-up.  We checked culture that time and culture was negative.  A follow-up in the office was seen by a different provider 11 days later.  Cannot have acute kidney injury.  Went to the ED.  CT scan showed hydronephrosis and hydroureter.  Foley was placed.  Over 1000 mL of urine were drained.  Had significant provement and back pain and fatigue.  Followed up a week later at a different office.  Creatinine was improving.  Symptoms have continued to improve through today.       Objective:  Physical Exam: BP (!) 163/77   Pulse 67   Temp 99 F (37.2 C) (Temporal)   Ht 5\' 10"  (1.778 m)   Wt 174 lb 3.2 oz (79 kg)   SpO2 97%   BMI 25.00 kg/m   Gen: No acute distress, resting comfortably CV: Regular rate and rhythm with no murmurs appreciated Pulm: Normal work of breathing, clear to auscultation bilaterally with no crackles, wheezes, or rhonchi Neuro: Grossly normal, moves all extremities Psych: Normal affect and thought content  Time Spent: 35 minutes of total time was spent on the date of the encounter performing the following actions: chart review prior to seeing the Terrence Hinton including recent ED visit and visits with other provider, obtaining history, performing a medically necessary exam, counseling on the treatment plan, placing orders, and documenting in our EHR.       Algis Greenhouse. Jerline Pain, MD 02/10/2020 12:52 PM

## 2020-02-11 LAB — COMPREHENSIVE METABOLIC PANEL
AG Ratio: 1.6 (calc) (ref 1.0–2.5)
ALT: 14 U/L (ref 9–46)
AST: 14 U/L (ref 10–35)
Albumin: 3.9 g/dL (ref 3.6–5.1)
Alkaline phosphatase (APISO): 69 U/L (ref 35–144)
BUN/Creatinine Ratio: 16 (calc) (ref 6–22)
BUN: 25 mg/dL (ref 7–25)
CO2: 26 mmol/L (ref 20–32)
Calcium: 8.8 mg/dL (ref 8.6–10.3)
Chloride: 108 mmol/L (ref 98–110)
Creat: 1.52 mg/dL — ABNORMAL HIGH (ref 0.70–1.11)
Globulin: 2.5 g/dL (calc) (ref 1.9–3.7)
Glucose, Bld: 114 mg/dL — ABNORMAL HIGH (ref 65–99)
Potassium: 4.5 mmol/L (ref 3.5–5.3)
Sodium: 143 mmol/L (ref 135–146)
Total Bilirubin: 0.3 mg/dL (ref 0.2–1.2)
Total Protein: 6.4 g/dL (ref 6.1–8.1)

## 2020-02-11 LAB — CBC
HCT: 32.3 % — ABNORMAL LOW (ref 38.5–50.0)
Hemoglobin: 11 g/dL — ABNORMAL LOW (ref 13.2–17.1)
MCH: 32.6 pg (ref 27.0–33.0)
MCHC: 34.1 g/dL (ref 32.0–36.0)
MCV: 95.8 fL (ref 80.0–100.0)
MPV: 10.8 fL (ref 7.5–12.5)
Platelets: 192 10*3/uL (ref 140–400)
RBC: 3.37 10*6/uL — ABNORMAL LOW (ref 4.20–5.80)
RDW: 13 % (ref 11.0–15.0)
WBC: 7.3 10*3/uL (ref 3.8–10.8)

## 2020-02-11 NOTE — Progress Notes (Signed)
Please inform patient of the following:  Blood work continuing to improve. Do not need to do any further testing at this point.  Would like for him to follow up with urology as we discussed.  Algis Greenhouse. Jerline Pain, MD 02/11/2020 11:43 AM

## 2020-02-17 LAB — FECAL GLOBIN BY IMMUNOCHEMISTRY
FECAL GLOBIN RESULT:: NOT DETECTED
MICRO NUMBER:: 11251518
SPECIMEN QUALITY:: ADEQUATE

## 2020-02-17 LAB — HOUSE ACCOUNT TRACKING

## 2020-02-18 NOTE — Progress Notes (Signed)
Please inform patient of the following:  Stool sample was NORMAL.  Algis Greenhouse. Jerline Pain, MD 02/18/2020 8:00 AM

## 2020-02-19 DIAGNOSIS — N130 Hydronephrosis with ureteropelvic junction obstruction: Secondary | ICD-10-CM | POA: Diagnosis not present

## 2020-02-19 DIAGNOSIS — N401 Enlarged prostate with lower urinary tract symptoms: Secondary | ICD-10-CM | POA: Diagnosis not present

## 2020-02-19 DIAGNOSIS — R338 Other retention of urine: Secondary | ICD-10-CM | POA: Diagnosis not present

## 2020-02-26 DIAGNOSIS — R338 Other retention of urine: Secondary | ICD-10-CM | POA: Diagnosis not present

## 2020-02-26 DIAGNOSIS — N130 Hydronephrosis with ureteropelvic junction obstruction: Secondary | ICD-10-CM | POA: Diagnosis not present

## 2020-02-26 DIAGNOSIS — N401 Enlarged prostate with lower urinary tract symptoms: Secondary | ICD-10-CM | POA: Diagnosis not present

## 2020-03-02 DIAGNOSIS — R338 Other retention of urine: Secondary | ICD-10-CM | POA: Diagnosis not present

## 2020-03-15 ENCOUNTER — Encounter (HOSPITAL_BASED_OUTPATIENT_CLINIC_OR_DEPARTMENT_OTHER): Payer: Self-pay | Admitting: Urology

## 2020-03-15 ENCOUNTER — Other Ambulatory Visit: Payer: Self-pay

## 2020-03-15 ENCOUNTER — Other Ambulatory Visit: Payer: Self-pay | Admitting: Urology

## 2020-03-15 NOTE — Progress Notes (Addendum)
Spoke w/ via phone for pre-op interview--- PT Lab needs dos---- no              Lab results------ no COVID test ------ 03-16-2020 @ 0940 Arrive at -------  0815 on 03-17-2020 NPO after MN NO Solid Food.  Clear liquids from MN until--- 0715 Medications to take morning of surgery ----- NONE Diabetic medication ----- n/a Patient Special Instructions ----- n/a Pre-Op special Istructions ----- pt stated he was staying overnight ,  Reviewed [facility] and visitor guidelines. Case just added on today, pre-op orders need second sign Patient verbalized understanding of instructions that were given at this phone interview. Patient denies shortness of breath, chest pain, fever, cough at this phone interview.

## 2020-03-16 ENCOUNTER — Other Ambulatory Visit (HOSPITAL_COMMUNITY)
Admission: RE | Admit: 2020-03-16 | Discharge: 2020-03-16 | Disposition: A | Discharge status: Home / Self Care | Payer: Medicare Other | Source: Ambulatory Visit | Attending: Urology | Admitting: Urology

## 2020-03-16 DIAGNOSIS — Z01812 Encounter for preprocedural laboratory examination: Secondary | ICD-10-CM | POA: Diagnosis not present

## 2020-03-16 DIAGNOSIS — Z20822 Contact with and (suspected) exposure to covid-19: Secondary | ICD-10-CM | POA: Insufficient documentation

## 2020-03-16 LAB — SARS CORONAVIRUS 2 (TAT 6-24 HRS): SARS Coronavirus 2: NEGATIVE

## 2020-03-16 NOTE — Anesthesia Preprocedure Evaluation (Signed)
Anesthesia Evaluation  Patient identified by MRN, date of birth, ID band Patient awake    Reviewed: Allergy & Precautions, NPO status , Patient's Chart, lab work & pertinent test results  History of Anesthesia Complications Negative for: history of anesthetic complications  Airway Mallampati: II  TM Distance: >3 FB Neck ROM: Full    Dental  (+) Loose, Missing,    Pulmonary former smoker,    Pulmonary exam normal        Cardiovascular negative cardio ROS Normal cardiovascular exam     Neuro/Psych negative neurological ROS  negative psych ROS   GI/Hepatic negative GI ROS, Neg liver ROS,   Endo/Other  negative endocrine ROS  Renal/GU negative Renal ROS   BPH    Musculoskeletal  (+) Arthritis ,   Abdominal   Peds  Hematology negative hematology ROS (+)   Anesthesia Other Findings Day of surgery medications reviewed with patient.  Reproductive/Obstetrics negative OB ROS                            Anesthesia Physical Anesthesia Plan  ASA: II  Anesthesia Plan: General   Post-op Pain Management:    Induction: Intravenous  PONV Risk Score and Plan: 2 and Treatment may vary due to age or medical condition, Ondansetron and Dexamethasone  Airway Management Planned: LMA  Additional Equipment:   Intra-op Plan:   Post-operative Plan: Extubation in OR  Informed Consent: I have reviewed the patients History and Physical, chart, labs and discussed the procedure including the risks, benefits and alternatives for the proposed anesthesia with the patient or authorized representative who has indicated his/her understanding and acceptance.       Plan Discussed with: CRNA  Anesthesia Plan Comments:        Anesthesia Quick Evaluation

## 2020-03-17 ENCOUNTER — Encounter (HOSPITAL_BASED_OUTPATIENT_CLINIC_OR_DEPARTMENT_OTHER): Payer: Self-pay | Admitting: Urology

## 2020-03-17 ENCOUNTER — Other Ambulatory Visit: Payer: Self-pay

## 2020-03-17 ENCOUNTER — Observation Stay (HOSPITAL_BASED_OUTPATIENT_CLINIC_OR_DEPARTMENT_OTHER)
Admission: RE | Admit: 2020-03-17 | Discharge: 2020-03-18 | Disposition: A | Discharge status: Home / Self Care | Payer: Medicare Other | Attending: Urology | Admitting: Urology

## 2020-03-17 ENCOUNTER — Ambulatory Visit (HOSPITAL_BASED_OUTPATIENT_CLINIC_OR_DEPARTMENT_OTHER): Payer: Medicare Other | Admitting: Anesthesiology

## 2020-03-17 ENCOUNTER — Encounter (HOSPITAL_BASED_OUTPATIENT_CLINIC_OR_DEPARTMENT_OTHER)
Admission: RE | Disposition: A | Discharge status: Home / Self Care | Payer: Self-pay | Source: Home / Self Care | Attending: Urology

## 2020-03-17 DIAGNOSIS — R338 Other retention of urine: Secondary | ICD-10-CM | POA: Insufficient documentation

## 2020-03-17 DIAGNOSIS — N320 Bladder-neck obstruction: Secondary | ICD-10-CM | POA: Insufficient documentation

## 2020-03-17 DIAGNOSIS — Z87891 Personal history of nicotine dependence: Secondary | ICD-10-CM | POA: Insufficient documentation

## 2020-03-17 DIAGNOSIS — N401 Enlarged prostate with lower urinary tract symptoms: Secondary | ICD-10-CM | POA: Diagnosis not present

## 2020-03-17 DIAGNOSIS — Z79899 Other long term (current) drug therapy: Secondary | ICD-10-CM | POA: Diagnosis not present

## 2020-03-17 DIAGNOSIS — N138 Other obstructive and reflux uropathy: Secondary | ICD-10-CM | POA: Diagnosis present

## 2020-03-17 HISTORY — DX: Benign prostatic hyperplasia with lower urinary tract symptoms: N13.8

## 2020-03-17 HISTORY — DX: Unspecified osteoarthritis, unspecified site: M19.90

## 2020-03-17 HISTORY — DX: Retention of urine, unspecified: R33.9

## 2020-03-17 HISTORY — DX: Other obstructive and reflux uropathy: N40.1

## 2020-03-17 HISTORY — DX: Presence of spectacles and contact lenses: Z97.3

## 2020-03-17 HISTORY — DX: Presence of dental prosthetic device (complete) (partial): Z97.2

## 2020-03-17 HISTORY — DX: Anemia, unspecified: D64.9

## 2020-03-17 HISTORY — PX: TRANSURETHRAL RESECTION OF PROSTATE: SHX73

## 2020-03-17 HISTORY — DX: Presence of other specified devices: Z97.8

## 2020-03-17 SURGERY — TURP (TRANSURETHRAL RESECTION OF PROSTATE)
Anesthesia: General

## 2020-03-17 MED ORDER — CIPROFLOXACIN IN D5W 400 MG/200ML IV SOLN
INTRAVENOUS | Status: AC
  Filled 2020-03-17: qty 200

## 2020-03-17 MED ORDER — PROPOFOL 10 MG/ML IV BOLUS
INTRAVENOUS | Status: AC
  Filled 2020-03-17: qty 60

## 2020-03-17 MED ORDER — SODIUM CHLORIDE 0.9 % IR SOLN
3000.0000 mL | Status: DC
  Administered 2020-03-17 – 2020-03-18 (×5): 3000 mL

## 2020-03-17 MED ORDER — GLYCOPYRROLATE 0.2 MG/ML IJ SOLN: INTRAMUSCULAR | Status: DC | PRN

## 2020-03-17 MED ORDER — ACETAMINOPHEN 325 MG PO TABS: 650.0000 mg | ORAL_TABLET | ORAL | Status: DC | PRN

## 2020-03-17 MED ORDER — ACETAMINOPHEN 500 MG PO TABS
1000.0000 mg | ORAL_TABLET | Freq: Once | ORAL | Status: AC
  Administered 2020-03-17: 1000 mg via ORAL

## 2020-03-17 MED ORDER — BELLADONNA ALKALOIDS-OPIUM 16.2-60 MG RE SUPP: 1.0000 | Freq: Four times a day (QID) | RECTAL | Status: DC | PRN

## 2020-03-17 MED ORDER — SODIUM CHLORIDE 0.9 % IV SOLN: INTRAVENOUS | Status: DC

## 2020-03-17 MED ORDER — ONDANSETRON HCL 4 MG/2ML IJ SOLN
INTRAMUSCULAR | Status: AC
  Filled 2020-03-17: qty 2

## 2020-03-17 MED ORDER — OXYCODONE HCL 5 MG PO TABS: 5.0000 mg | ORAL_TABLET | Freq: Once | ORAL | Status: DC | PRN

## 2020-03-17 MED ORDER — PROPOFOL 10 MG/ML IV BOLUS
INTRAVENOUS | Status: DC | PRN
  Administered 2020-03-17: 130 mg via INTRAVENOUS

## 2020-03-17 MED ORDER — DIPHENHYDRAMINE HCL 12.5 MG/5ML PO ELIX
12.5000 mg | ORAL_SOLUTION | Freq: Four times a day (QID) | ORAL | Status: DC | PRN
Start: 2020-03-17 — End: 2020-03-18

## 2020-03-17 MED ORDER — FENTANYL CITRATE (PF) 250 MCG/5ML IJ SOLN
INTRAMUSCULAR | Status: AC
  Filled 2020-03-17: qty 5

## 2020-03-17 MED ORDER — CIPROFLOXACIN IN D5W 400 MG/200ML IV SOLN
400.0000 mg | Freq: Once | INTRAVENOUS | Status: AC
  Administered 2020-03-17: 400 mg via INTRAVENOUS

## 2020-03-17 MED ORDER — DEXAMETHASONE SODIUM PHOSPHATE 10 MG/ML IJ SOLN
INTRAMUSCULAR | Status: AC
  Filled 2020-03-17: qty 1

## 2020-03-17 MED ORDER — LIDOCAINE HCL (PF) 2 % IJ SOLN
INTRAMUSCULAR | Status: AC
  Filled 2020-03-17: qty 5

## 2020-03-17 MED ORDER — FENTANYL CITRATE (PF) 100 MCG/2ML IJ SOLN
INTRAMUSCULAR | Status: DC | PRN
  Administered 2020-03-17 (×3): 25 ug via INTRAVENOUS

## 2020-03-17 MED ORDER — MORPHINE SULFATE (PF) 4 MG/ML IV SOLN: 2.0000 mg | INTRAVENOUS | Status: DC | PRN

## 2020-03-17 MED ORDER — ACETAMINOPHEN 500 MG PO TABS
ORAL_TABLET | ORAL | Status: AC
  Filled 2020-03-17: qty 2

## 2020-03-17 MED ORDER — ONDANSETRON HCL 4 MG/2ML IJ SOLN
INTRAMUSCULAR | Status: DC | PRN
  Administered 2020-03-17: 4 mg via INTRAVENOUS

## 2020-03-17 MED ORDER — LACTATED RINGERS IV SOLN
INTRAVENOUS | Status: DC
  Administered 2020-03-17: 50 mL via INTRAVENOUS

## 2020-03-17 MED ORDER — BELLADONNA ALKALOIDS-OPIUM 16.2-60 MG RE SUPP
RECTAL | Status: AC
  Filled 2020-03-17: qty 1

## 2020-03-17 MED ORDER — OXYBUTYNIN CHLORIDE 5 MG PO TABS: 5.0000 mg | ORAL_TABLET | Freq: Three times a day (TID) | ORAL | Status: DC | PRN

## 2020-03-17 MED ORDER — PROMETHAZINE HCL 25 MG/ML IJ SOLN: 6.2500 mg | INTRAMUSCULAR | Status: DC | PRN

## 2020-03-17 MED ORDER — DEXAMETHASONE SODIUM PHOSPHATE 4 MG/ML IJ SOLN
INTRAMUSCULAR | Status: DC | PRN
  Administered 2020-03-17: 4 mg via INTRAVENOUS

## 2020-03-17 MED ORDER — LIDOCAINE HCL (CARDIAC) PF 100 MG/5ML IV SOSY
PREFILLED_SYRINGE | INTRAVENOUS | Status: DC | PRN
  Administered 2020-03-17: 80 mg via INTRAVENOUS

## 2020-03-17 MED ORDER — CIPROFLOXACIN IN D5W 400 MG/200ML IV SOLN
400.0000 mg | Freq: Two times a day (BID) | INTRAVENOUS | Status: DC
  Administered 2020-03-17: 400 mg via INTRAVENOUS

## 2020-03-17 MED ORDER — HYDROCODONE-ACETAMINOPHEN 5-325 MG PO TABS: 1.0000 | ORAL_TABLET | ORAL | Status: DC | PRN

## 2020-03-17 MED ORDER — DIPHENHYDRAMINE HCL 50 MG/ML IJ SOLN: 12.5000 mg | Freq: Four times a day (QID) | INTRAMUSCULAR | Status: DC | PRN

## 2020-03-17 MED ORDER — FENTANYL CITRATE (PF) 100 MCG/2ML IJ SOLN: 25.0000 ug | INTRAMUSCULAR | Status: DC | PRN

## 2020-03-17 MED ORDER — OXYCODONE HCL 5 MG/5ML PO SOLN: 5.0000 mg | Freq: Once | ORAL | Status: DC | PRN

## 2020-03-17 MED ORDER — ONDANSETRON HCL 4 MG/2ML IJ SOLN: 4.0000 mg | INTRAMUSCULAR | Status: DC | PRN

## 2020-03-17 MED ORDER — BELLADONNA ALKALOIDS-OPIUM 16.2-60 MG RE SUPP
RECTAL | Status: DC | PRN
  Administered 2020-03-17: 1 via RECTAL

## 2020-03-17 MED ORDER — GLYCOPYRROLATE 0.2 MG/ML IJ SOLN
INTRAMUSCULAR | Status: DC | PRN
  Administered 2020-03-17: .2 mg via INTRAVENOUS

## 2020-03-17 SURGICAL SUPPLY — 19 items
BAG DRAIN URO-CYSTO SKYTR STRL (DRAIN) ×2 IMPLANT
BAG URINE DRAIN 2000ML AR STRL (UROLOGICAL SUPPLIES) ×2 IMPLANT
BAND RUBBER #18 3X1/16 STRL (MISCELLANEOUS) IMPLANT
CATH FOLEY 3WAY 30CC 22FR (CATHETERS) IMPLANT
CATH FOLEY 3WAY 30CC 24FR (CATHETERS)
CATH URTH STD 24FR FL 3W 2 (CATHETERS) IMPLANT
GLOVE BIO SURGEON STRL SZ7.5 (GLOVE) ×2 IMPLANT
GOWN STRL REUS W/TWL XL LVL3 (GOWN DISPOSABLE) ×2 IMPLANT
HOLDER FOLEY CATH W/STRAP (MISCELLANEOUS) IMPLANT
KIT TURNOVER CYSTO (KITS) ×2 IMPLANT
LOOP CUT BIPOLAR 24F LRG (ELECTROSURGICAL) IMPLANT
MANIFOLD NEPTUNE II (INSTRUMENTS) ×2 IMPLANT
PACK CYSTO (CUSTOM PROCEDURE TRAY) ×2 IMPLANT
PIN SAFETY STERILE (MISCELLANEOUS) ×2 IMPLANT
SYR 30ML LL (SYRINGE) ×2 IMPLANT
SYR TOOMEY IRRIG 70ML (MISCELLANEOUS) ×2
SYRINGE TOOMEY IRRIG 70ML (MISCELLANEOUS) ×1 IMPLANT
TUBE CONNECTING 12X1/4 (SUCTIONS) ×2 IMPLANT
TUBING UROLOGY SET (TUBING) ×2 IMPLANT

## 2020-03-17 NOTE — H&P (Signed)
Urology Preoperative H&P   Chief Complaint: Urinary retention  History of Present Illness: Terrence Hinton is a 87 y.o. male with BPH and ongoing urinary retention requiring an indwelling Foley catheter.  He has failed multiple voiding trials despite alpha-blocker therapy.  He had urodynamics that revealed a bladder capacity of approximately 200 mL with adequate sensation and contractility.  He was recently found to have an ESBL E. coli infection that has been appropriately treated with a course of levofloxacin leading up to surgery.  He denies interval fever/chills, hematuria or leakage around his catheter.    Past Medical History:  Diagnosis Date  . Anemia   . Arthritis    hand hip  . BPH with urinary obstruction   . Foley catheter in place   . Urinary retention   . Wears glasses   . Wears partial dentures    lower    Past Surgical History:  Procedure Laterality Date  . TONSILLECTOMY  1945    Allergies: No Known Allergies  Family History  Problem Relation Age of Onset  . Heart attack Mother   . Cancer Father   . Early death Father     Social History:  reports that he quit smoking about 42 years ago. His smoking use included cigarettes. He quit after 20.00 years of use. He has never used smokeless tobacco. He reports previous alcohol use. He reports that he does not use drugs.  ROS: A complete review of systems was performed.  All systems are negative except for pertinent findings as noted.  Physical Exam:  Vital signs in last 24 hours:   Constitutional:  Alert and oriented, No acute distress Cardiovascular: Regular rate and rhythm, No JVD Respiratory: Normal respiratory effort, Lungs clear bilaterally GI: Abdomen is soft, nontender, nondistended, no abdominal masses GU: No CVA tenderness, Foley in place and draining clear-yellow urine Lymphatic: No lymphadenopathy Neurologic: Grossly intact, no focal deficits Psychiatric: Normal mood and affect  Laboratory Data:  No  results for input(s): WBC, HGB, HCT, PLT in the last 72 hours.  No results for input(s): NA, K, CL, GLUCOSE, BUN, CALCIUM, CREATININE in the last 72 hours.  Invalid input(s): CO3   Results for orders placed or performed during the hospital encounter of 03/16/20 (from the past 24 hour(s))  SARS CORONAVIRUS 2 (TAT 6-24 HRS) Nasopharyngeal Nasopharyngeal Swab     Status: None   Collection Time: 03/16/20 10:33 AM   Specimen: Nasopharyngeal Swab  Result Value Ref Range   SARS Coronavirus 2 NEGATIVE NEGATIVE   Recent Results (from the past 240 hour(s))  SARS CORONAVIRUS 2 (TAT 6-24 HRS) Nasopharyngeal Nasopharyngeal Swab     Status: None   Collection Time: 03/16/20 10:33 AM   Specimen: Nasopharyngeal Swab  Result Value Ref Range Status   SARS Coronavirus 2 NEGATIVE NEGATIVE Final    Comment: (NOTE) SARS-CoV-2 target nucleic acids are NOT DETECTED.  The SARS-CoV-2 RNA is generally detectable in upper and lower respiratory specimens during the acute phase of infection. Negative results do not preclude SARS-CoV-2 infection, do not rule out co-infections with other pathogens, and should not be used as the sole basis for treatment or other patient management decisions. Negative results must be combined with clinical observations, patient history, and epidemiological information. The expected result is Negative.  Fact Sheet for Patients: HairSlick.no  Fact Sheet for Healthcare Providers: quierodirigir.com  This test is not yet approved or cleared by the [place] FDA and  has been authorized for detection and/or diagnosis of SARS-CoV-2  by FDA under an Emergency Use Authorization (EUA). This EUA will remain  in effect (meaning this test can be used) for the duration of the COVID-19 declaration under Se ction 564(b)(1) of the Act, 21 U.S.C. section 360bbb-3(b)(1), unless the authorization is terminated or revoked  sooner.  Performed at [facility], [place] [address] [place], [place] [address]     Renal Function: No results for input(s): CREATININE in the last 168 hours. CrCl cannot be calculated (Patient's most recent lab result is older than the maximum 21 days allowed.).  Radiologic Imaging: No results found.  I independently reviewed the above imaging studies.  Assessment and Plan Terrence Hinton is a 87 y.o. male with BPH with urinary retention requiring an indwelling Foley catheter  -The risks, benefits and alternatives of bipolar TURP was discussed in detail.  Risk include, but are not limited to, bleeding, urinary tract infection, worsening lower Neri tract symptoms, persistent urinary retention, urethral stricture disease, MI, CVA, DVT, PE and the inherent risk of general anesthesia.  Voices understanding wishes to proceed.  Ellison Hughs, MD 03/17/2020, 8:44 AM  [facility] Pager: [phone]

## 2020-03-17 NOTE — Op Note (Signed)
Operative Note  Preoperative diagnosis:  1.  BPH with bladder outlet obstruction and urinary retention  Postoperative diagnosis: 1.  BPH with bladder outlet obstruction and urinary retention  Procedure(s): 1.  Bipolar TURP  Surgeon: Rhoderick Moody, MD  Assistants:  None  Anesthesia:  General  Complications:  None  EBL: 50 mL  Specimens: 1. Prostate chips  Drains/Catheters: 1.  22 French three-way Foley catheter with 20 mL of sterile water in the balloon  Intraoperative findings:   1. Trilobar prostatic urethral obstruction  Indication:  Terrence Hinton is a 87 y.o. male with BPH with lower urinary tract symptoms and recent issues with urinary retention requiring a chronic indwelling Foley catheter.  The patient underwent urodynamics that showed adequate bladder function and sensation.  He has been consented for the above procedures, voices understanding and wishes to proceed.  Description of procedure:  After informed consent was obtained, the patient was brought to the operating room and general anesthesia was administered. The patient was then placed in the dorsolithotomy position and prepped and draped in usual sterile fashion. A timeout was performed. A 23 French rigid cystoscope was then inserted into the urethral meatus and advanced into the bladder under direct vision. A complete bladder survey revealed no intravesical pathology.  Both ureteral orifices were identified and well away from the bladder neck.  The rigid cystoscope was then exchanged for a 26 French resectoscope with a bipolar loop working element.  Starting at the bladder neck and progressing distally to the verumontanum, the prostatic adenoma was systematically resected until a widely patent prostatic urethral channel was created.  All prostate chips were then hand irrigated out of the bladder and sent to pathology for permanent section.  The resectoscope was then removed and exchanged for a 22 French  three-way Foley catheter.  The three-way Foley catheter was then extensively hand irrigated until the irrigant returned clear to light pink.  The catheter was then placed to continuous bladder irrigation and placed on rubber band traction.  He tolerated the procedure well and was transferred to the postanesthesia unit in stable condition.  Plan:  CBI overnight

## 2020-03-17 NOTE — Anesthesia Postprocedure Evaluation (Signed)
Anesthesia Post Note  Patient: Valery Chance  Procedure(s) Performed: TRANSURETHRAL RESECTION OF THE PROSTATE (TURP), BIPOLAR (N/A )     Patient location during evaluation: PACU Anesthesia Type: General Level of consciousness: awake and alert and oriented Pain management: pain level controlled Vital Signs Assessment: post-procedure vital signs reviewed and stable Respiratory status: spontaneous breathing, nonlabored ventilation and respiratory function stable Cardiovascular status: blood pressure returned to baseline Postop Assessment: no apparent nausea or vomiting Anesthetic complications: no   No complications documented.  Last Vitals:  Vitals:   03/17/20 1202 03/17/20 1215  BP: (!) 153/81 (!) 158/79  Pulse: (!) 50 (!) 50  Resp: 14 16  Temp:  (!) 36.3 C  SpO2: 97% 97%    Last Pain:  Vitals:   03/17/20 1215  TempSrc:   PainSc: 0-No pain                 Kaylyn Layer

## 2020-03-17 NOTE — Transfer of Care (Signed)
Immediate Anesthesia Transfer of Care Note  Patient: Terrence Hinton  Procedure(s) Performed: TRANSURETHRAL RESECTION OF THE PROSTATE (TURP), BIPOLAR (N/A )  Patient Location: PACU  Anesthesia Type:General  Level of Consciousness: awake, alert  and patient cooperative  Airway & Oxygen Therapy: Patient Spontanous Breathing  Post-op Assessment: Report given to RN and Post -op Vital signs reviewed and stable  Post vital signs: Reviewed and stable  Last Vitals:  Vitals Value Taken Time  BP 173/84 03/17/20 1138  Temp    Pulse 57 03/17/20 1139  Resp 9 03/17/20 1139  SpO2 94 % 03/17/20 1139  Vitals shown include unvalidated device data.  Last Pain:  Vitals:   03/17/20 0848  TempSrc: Oral  PainSc: 0-No pain      Patients Stated Pain Goal: 5 (03/17/20 0848)  Complications: No complications documented.

## 2020-03-17 NOTE — Anesthesia Procedure Notes (Signed)
Procedure Name: LMA Insertion Date/Time: 03/17/2020 10:21 AM Performed by: Earmon Phoenix, CRNA Pre-anesthesia Checklist: Patient identified, Emergency Drugs available, Suction available, Patient being monitored and Timeout performed Patient Re-evaluated:Patient Re-evaluated prior to induction Oxygen Delivery Method: Circle system utilized Preoxygenation: Pre-oxygenation with 100% oxygen Induction Type: IV induction Tube size: 4.0 mm Number of attempts: 1 Placement Confirmation: positive ETCO2,  CO2 detector and breath sounds checked- equal and bilateral Tube secured with: Tape Dental Injury: Teeth and Oropharynx as per pre-operative assessment

## 2020-03-18 ENCOUNTER — Encounter (HOSPITAL_BASED_OUTPATIENT_CLINIC_OR_DEPARTMENT_OTHER): Payer: Self-pay | Admitting: Urology

## 2020-03-18 DIAGNOSIS — Z79899 Other long term (current) drug therapy: Secondary | ICD-10-CM | POA: Diagnosis not present

## 2020-03-18 DIAGNOSIS — Z87891 Personal history of nicotine dependence: Secondary | ICD-10-CM | POA: Diagnosis not present

## 2020-03-18 DIAGNOSIS — N320 Bladder-neck obstruction: Secondary | ICD-10-CM | POA: Diagnosis not present

## 2020-03-18 DIAGNOSIS — N401 Enlarged prostate with lower urinary tract symptoms: Secondary | ICD-10-CM | POA: Diagnosis not present

## 2020-03-18 DIAGNOSIS — R338 Other retention of urine: Secondary | ICD-10-CM | POA: Diagnosis not present

## 2020-03-18 DIAGNOSIS — N138 Other obstructive and reflux uropathy: Secondary | ICD-10-CM | POA: Diagnosis not present

## 2020-03-18 LAB — CBC
HCT: 30.5 % — ABNORMAL LOW (ref 39.0–52.0)
Hemoglobin: 9.8 g/dL — ABNORMAL LOW (ref 13.0–17.0)
MCH: 31.7 pg (ref 26.0–34.0)
MCHC: 32.1 g/dL (ref 30.0–36.0)
MCV: 98.7 fL (ref 80.0–100.0)
Platelets: 154 10*3/uL (ref 150–400)
RBC: 3.09 MIL/uL — ABNORMAL LOW (ref 4.22–5.81)
RDW: 14.4 % (ref 11.5–15.5)
WBC: 10.2 10*3/uL (ref 4.0–10.5)
nRBC: 0 % (ref 0.0–0.2)

## 2020-03-18 LAB — BASIC METABOLIC PANEL
Anion gap: 7 (ref 5–15)
BUN: 24 mg/dL — ABNORMAL HIGH (ref 8–23)
CO2: 25 mmol/L (ref 22–32)
Calcium: 8.3 mg/dL — ABNORMAL LOW (ref 8.9–10.3)
Chloride: 107 mmol/L (ref 98–111)
Creatinine, Ser: 1.15 mg/dL (ref 0.61–1.24)
GFR, Estimated: >60 mL/min (ref >60)
Glucose, Bld: 132 mg/dL — ABNORMAL HIGH (ref 70–99)
Potassium: 4.1 mmol/L (ref 3.5–5.1)
Sodium: 139 mmol/L (ref 135–145)

## 2020-03-18 MED ORDER — PHENAZOPYRIDINE HCL 200 MG PO TABS: 200.0000 mg | ORAL_TABLET | Freq: Three times a day (TID) | ORAL | 0 refills | Status: AC | PRN

## 2020-03-18 MED ORDER — OXYBUTYNIN CHLORIDE 5 MG PO TABS: 5.0000 mg | ORAL_TABLET | Freq: Three times a day (TID) | ORAL | 1 refills | Status: DC | PRN

## 2020-03-18 MED ORDER — TRAMADOL HCL 50 MG PO TABS: 50.0000 mg | ORAL_TABLET | Freq: Four times a day (QID) | ORAL | 0 refills | Status: AC | PRN

## 2020-03-18 NOTE — Discharge Summary (Signed)
Date of admission: 03/17/2020  Date of discharge: 03/18/2020  Admission diagnosis: BPH with urinary retention  Discharge diagnosis: Same  Procedures: Bipolar TURP  History and Physical: For full details, please see admission history and physical. Briefly, Terrence Hinton is a 87 y.o. year old patient with BPH with ongoing urinary retention requiring an indwelling Foley catheter.   Hospital Course: Following his TURP, the patient was monitored on the floor.  His urine remained slightly blood-tinged, but with minimal debris postoperatively.  He denied any  associated pain from his catheter.  He was discharged home with his Foley catheter with plans for a voiding trial in approximately 5 days  Physical Exam:  General: Alert and oriented CV: RRR, palpable distal pulses Lungs: CTAB, equal chest rise Abdomen: Soft, NTND, no rebound or guarding GU: 22 French three-way Foley catheter in place and draining slightly blood-tinged urine without clots Ext: NT, No erythema  Laboratory values: Recent Labs    03/18/20 0041  HGB 9.8*  HCT 30.5*   Recent Labs    03/18/20 0041  CREATININE 1.15    Disposition: Home  Discharge instruction: The patient was instructed to be ambulatory but told to refrain from heavy lifting, strenuous activity, or driving.  Discharge medications:  Allergies as of 03/18/2020   No Known Allergies     Medication List    TAKE these medications   Fish Oil 1000 MG Caps Take by mouth.   glucosamine-chondroitin 500-400 MG tablet Take 1 tablet by mouth 3 (three) times daily.   levofloxacin 250 MG tablet Commonly known as: LEVAQUIN Take 250 mg by mouth daily.   multivitamin capsule Take 1 capsule by mouth daily.   oxybutynin 5 MG tablet Commonly known as: DITROPAN Take 1 tablet (5 mg total) by mouth every 8 (eight) hours as needed for bladder spasms.   phenazopyridine 200 MG tablet Commonly known as: Pyridium Take 1 tablet (200 mg total) by mouth 3 (three) times  daily as needed (for pain with urination).   tamsulosin 0.4 MG Caps capsule Commonly known as: FLOMAX Take 0.4 mg by mouth.   traMADol 50 MG tablet Commonly known as: Ultram Take 1 tablet (50 mg total) by mouth every 6 (six) hours as needed for up to 3 days.       Followup:   Follow-up Information    [facility] On 03/22/2020.   Why: Catheter removal at 9:15 AM Contact information: [address] [place] [address] [phone]

## 2020-03-19 LAB — SURGICAL PATHOLOGY

## 2020-03-31 DIAGNOSIS — R030 Elevated blood-pressure reading, without diagnosis of hypertension: Secondary | ICD-10-CM | POA: Diagnosis not present

## 2020-03-31 DIAGNOSIS — R600 Localized edema: Secondary | ICD-10-CM | POA: Diagnosis not present

## 2020-04-01 DIAGNOSIS — N401 Enlarged prostate with lower urinary tract symptoms: Secondary | ICD-10-CM | POA: Diagnosis not present

## 2020-04-01 DIAGNOSIS — R338 Other retention of urine: Secondary | ICD-10-CM | POA: Diagnosis not present

## 2020-04-23 DIAGNOSIS — N139 Obstructive and reflux uropathy, unspecified: Secondary | ICD-10-CM | POA: Diagnosis not present

## 2020-06-07 DIAGNOSIS — H43811 Vitreous degeneration, right eye: Secondary | ICD-10-CM | POA: Diagnosis not present

## 2020-06-24 DIAGNOSIS — R3914 Feeling of incomplete bladder emptying: Secondary | ICD-10-CM | POA: Diagnosis not present

## 2020-06-24 DIAGNOSIS — N401 Enlarged prostate with lower urinary tract symptoms: Secondary | ICD-10-CM | POA: Diagnosis not present

## 2020-06-24 DIAGNOSIS — R8279 Other abnormal findings on microbiological examination of urine: Secondary | ICD-10-CM | POA: Diagnosis not present

## 2020-07-13 DIAGNOSIS — Z23 Encounter for immunization: Secondary | ICD-10-CM | POA: Diagnosis not present

## 2020-08-19 ENCOUNTER — Telehealth: Payer: Self-pay

## 2020-08-19 NOTE — Telephone Encounter (Signed)
I left a message asking the patient to call and schedule Medicare AWV with Otila Kluver ([place]).  If patient calls back, please schedule Medicare Wellness Visit at next available opening. Last AWV 06/24/2019 [facility] ([place])

## 2020-09-28 DIAGNOSIS — N3941 Urge incontinence: Secondary | ICD-10-CM | POA: Diagnosis not present

## 2020-09-28 DIAGNOSIS — R3914 Feeling of incomplete bladder emptying: Secondary | ICD-10-CM | POA: Diagnosis not present

## 2020-09-28 DIAGNOSIS — N401 Enlarged prostate with lower urinary tract symptoms: Secondary | ICD-10-CM | POA: Diagnosis not present

## 2020-10-14 DIAGNOSIS — R3914 Feeling of incomplete bladder emptying: Secondary | ICD-10-CM | POA: Diagnosis not present

## 2020-10-14 DIAGNOSIS — N3000 Acute cystitis without hematuria: Secondary | ICD-10-CM | POA: Diagnosis not present

## 2020-10-14 DIAGNOSIS — N401 Enlarged prostate with lower urinary tract symptoms: Secondary | ICD-10-CM | POA: Diagnosis not present

## 2020-10-21 DIAGNOSIS — Z79899 Other long term (current) drug therapy: Secondary | ICD-10-CM | POA: Diagnosis not present

## 2020-10-21 DIAGNOSIS — H6123 Impacted cerumen, bilateral: Secondary | ICD-10-CM | POA: Diagnosis not present

## 2020-10-21 DIAGNOSIS — Z0000 Encounter for general adult medical examination without abnormal findings: Secondary | ICD-10-CM | POA: Diagnosis not present

## 2020-10-21 DIAGNOSIS — R030 Elevated blood-pressure reading, without diagnosis of hypertension: Secondary | ICD-10-CM | POA: Diagnosis not present

## 2020-10-27 DIAGNOSIS — D649 Anemia, unspecified: Secondary | ICD-10-CM | POA: Diagnosis not present

## 2020-11-18 DIAGNOSIS — D649 Anemia, unspecified: Secondary | ICD-10-CM | POA: Diagnosis not present

## 2021-03-15 DIAGNOSIS — H02005 Unspecified entropion of left lower eyelid: Secondary | ICD-10-CM | POA: Diagnosis not present

## 2021-05-06 DIAGNOSIS — H02132 Senile ectropion of right lower eyelid: Secondary | ICD-10-CM | POA: Diagnosis not present

## 2021-05-06 DIAGNOSIS — D485 Neoplasm of uncertain behavior of skin: Secondary | ICD-10-CM | POA: Diagnosis not present

## 2021-05-06 DIAGNOSIS — H04562 Stenosis of left lacrimal punctum: Secondary | ICD-10-CM | POA: Diagnosis not present

## 2021-05-06 DIAGNOSIS — L308 Other specified dermatitis: Secondary | ICD-10-CM | POA: Diagnosis not present

## 2021-05-06 DIAGNOSIS — H04203 Unspecified epiphora, bilateral lacrimal glands: Secondary | ICD-10-CM | POA: Diagnosis not present

## 2021-06-10 DIAGNOSIS — H04523 Eversion of bilateral lacrimal punctum: Secondary | ICD-10-CM | POA: Diagnosis not present

## 2021-06-10 DIAGNOSIS — H0015 Chalazion left lower eyelid: Secondary | ICD-10-CM | POA: Diagnosis not present

## 2021-06-10 DIAGNOSIS — H02132 Senile ectropion of right lower eyelid: Secondary | ICD-10-CM | POA: Diagnosis not present

## 2021-06-10 DIAGNOSIS — H04552 Acquired stenosis of left nasolacrimal duct: Secondary | ICD-10-CM | POA: Diagnosis not present

## 2021-06-16 DIAGNOSIS — R3914 Feeling of incomplete bladder emptying: Secondary | ICD-10-CM | POA: Diagnosis not present

## 2021-06-16 DIAGNOSIS — N401 Enlarged prostate with lower urinary tract symptoms: Secondary | ICD-10-CM | POA: Diagnosis not present

## 2021-06-23 DIAGNOSIS — H02132 Senile ectropion of right lower eyelid: Secondary | ICD-10-CM | POA: Diagnosis not present

## 2021-06-23 DIAGNOSIS — H02135 Senile ectropion of left lower eyelid: Secondary | ICD-10-CM | POA: Diagnosis not present

## 2021-06-23 DIAGNOSIS — Z01818 Encounter for other preprocedural examination: Secondary | ICD-10-CM | POA: Diagnosis not present

## 2021-06-23 DIAGNOSIS — H04523 Eversion of bilateral lacrimal punctum: Secondary | ICD-10-CM | POA: Diagnosis not present

## 2021-07-05 DIAGNOSIS — Z961 Presence of intraocular lens: Secondary | ICD-10-CM | POA: Diagnosis not present

## 2021-07-07 DIAGNOSIS — H02132 Senile ectropion of right lower eyelid: Secondary | ICD-10-CM | POA: Diagnosis not present

## 2021-07-07 DIAGNOSIS — H0015 Chalazion left lower eyelid: Secondary | ICD-10-CM | POA: Diagnosis not present

## 2021-07-07 DIAGNOSIS — H04523 Eversion of bilateral lacrimal punctum: Secondary | ICD-10-CM | POA: Diagnosis not present

## 2021-07-07 DIAGNOSIS — H04552 Acquired stenosis of left nasolacrimal duct: Secondary | ICD-10-CM | POA: Diagnosis not present

## 2021-07-07 DIAGNOSIS — H02135 Senile ectropion of left lower eyelid: Secondary | ICD-10-CM | POA: Diagnosis not present

## 2021-11-22 DIAGNOSIS — R030 Elevated blood-pressure reading, without diagnosis of hypertension: Secondary | ICD-10-CM | POA: Diagnosis not present

## 2021-11-22 DIAGNOSIS — Z23 Encounter for immunization: Secondary | ICD-10-CM | POA: Diagnosis not present

## 2021-11-22 DIAGNOSIS — N1831 Chronic kidney disease, stage 3a: Secondary | ICD-10-CM | POA: Diagnosis not present

## 2021-11-22 DIAGNOSIS — R9431 Abnormal electrocardiogram [ECG] [EKG]: Secondary | ICD-10-CM | POA: Diagnosis not present

## 2021-11-22 DIAGNOSIS — Z0000 Encounter for general adult medical examination without abnormal findings: Secondary | ICD-10-CM | POA: Diagnosis not present

## 2021-11-22 DIAGNOSIS — D649 Anemia, unspecified: Secondary | ICD-10-CM | POA: Diagnosis not present

## 2021-11-30 DIAGNOSIS — R9431 Abnormal electrocardiogram [ECG] [EKG]: Secondary | ICD-10-CM | POA: Diagnosis not present

## 2021-11-30 DIAGNOSIS — R030 Elevated blood-pressure reading, without diagnosis of hypertension: Secondary | ICD-10-CM | POA: Diagnosis not present

## 2021-12-06 DIAGNOSIS — R030 Elevated blood-pressure reading, without diagnosis of hypertension: Secondary | ICD-10-CM | POA: Diagnosis not present

## 2021-12-22 ENCOUNTER — Ambulatory Visit: Payer: Medicare Other | Admitting: Cardiology

## 2021-12-22 ENCOUNTER — Encounter: Payer: Self-pay | Admitting: Cardiology

## 2021-12-22 VITALS — BP 178/77 | HR 52 | Resp 16 | Ht 70.0 in | Wt 183.0 lb

## 2021-12-22 DIAGNOSIS — I351 Nonrheumatic aortic (valve) insufficiency: Secondary | ICD-10-CM | POA: Diagnosis not present

## 2021-12-22 DIAGNOSIS — I10 Essential (primary) hypertension: Secondary | ICD-10-CM | POA: Diagnosis not present

## 2021-12-22 NOTE — Progress Notes (Addendum)
ID:  Terrence Hinton, DOB [birth year], MRN [id number]  PCP:  No primary care provider on file.  Cardiologist:  Rex Kras, DO, [facility] (established care 12/22/2021)  REASON FOR CONSULT: Aortic regurgitation  REQUESTING PHYSICIAN:  Seward Carol, MD [place] [employer] [address] [place],  [place] [address]  Chief Complaint  Patient presents with   Aortic regurgitation    HPI  Terrence Hinton is a 88 y.o. Caucasian male who presents to the clinic for evaluation of aortic regurgitation at the request of Seward Carol, MD. His past medical history and cardiovascular risk factors include: Aortic regurgitation, hypertension, former smoker, BPH.   Patient was referred to the the office for evaluation of aortic regurgitation after having an echo at his PCPs office.  I do not have the echo report for review at today's encounter but we will request records.  The echocardiogram was performed as a routine testing likely secondary to elevated blood pressures.  He is noted to have moderate to severe aortic regurgitation based on the documentation I received from the PCPs office.  Clinically he denies anginal discomfort or heart failure symptoms.  He is not complaining of feeling tired or fatigued.  He was recently started on losartan and he checks his blood pressures at home every other day and his SBP is 140 mmHg to the best of his recollection.  I do not have a blood pressure log for review.  FUNCTIONAL STATUS: Golf several times a week and walks the dog everyday.    ALLERGIES: No Known Allergies  MEDICATION LIST PRIOR TO VISIT: Current Meds  Medication Sig   glucosamine-chondroitin 500-400 MG tablet Take 1 tablet by mouth 3 (three) times daily.   losartan (COZAAR) 25 MG tablet Take 25 mg by mouth daily.   Multiple Vitamin (MULTIVITAMIN) capsule Take 1 capsule by mouth daily.   Omega-3 Fatty Acids (FISH OIL) 1000 MG CAPS Take by mouth.     PAST MEDICAL HISTORY: Past Medical History:  Diagnosis  Date   Anemia    Arthritis    hand hip   BPH with urinary obstruction    Foley catheter in place    Hypertension    Urinary retention    Wears glasses    Wears partial dentures    lower    PAST SURGICAL HISTORY: Past Surgical History:  Procedure Laterality Date   TONSILLECTOMY  1945   TRANSURETHRAL RESECTION OF PROSTATE N/A 03/17/2020   Procedure: TRANSURETHRAL RESECTION OF THE PROSTATE (TURP), BIPOLAR;  Surgeon: Ceasar Mons, MD;  Location: [facility];  Service: Urology;  Laterality: N/A;    FAMILY HISTORY: The patient family history includes Cancer in his father; Early death in his father; Heart attack in his mother.  SOCIAL HISTORY:  The patient  reports that he quit smoking about 44 years ago. His smoking use included cigarettes. He has never used smokeless tobacco. He reports that he does not currently use alcohol. He reports that he does not use drugs.  REVIEW OF SYSTEMS: Review of Systems  Cardiovascular:  Negative for chest pain, claudication, dyspnea on exertion, irregular heartbeat, leg swelling, near-syncope, orthopnea, palpitations, paroxysmal nocturnal dyspnea and syncope.  Respiratory:  Negative for shortness of breath.   Hematologic/Lymphatic: Negative for bleeding problem.  Musculoskeletal:  Negative for muscle cramps and myalgias.  Neurological:  Negative for dizziness and light-headedness.    PHYSICAL EXAM:    12/22/2021    8:55 AM 12/22/2021    8:54 AM 03/18/2020    8:31  AM  Vitals with BMI  Height  '5\' 10"'$    Weight  183 lbs   BMI  02.40   Systolic 973 532 992  Diastolic 77 82 52  Pulse 52 63 57   Physical Exam  Constitutional: No distress.  Age appropriate, hemodynamically stable.   Neck: No JVD present.  Cardiovascular: Normal rate, regular rhythm, S1 normal, S2 normal and intact distal pulses. Exam reveals no gallop, no S3 and no S4.  Murmur heard. Decrescendo early diastolic murmur is present with a grade of 3/4 at  the upper right sternal border radiating to the apex. Pulses:      Popliteal pulses are 2+ on the right side and 2+ on the left side.       Dorsalis pedis pulses are 1+ on the right side and 1+ on the left side.       Posterior tibial pulses are 2+ on the right side and 2+ on the left side.  Pulmonary/Chest: Effort normal and breath sounds normal. No stridor. He has no wheezes. He has no rales.  Abdominal: Soft. Bowel sounds are normal. He exhibits no distension. There is no abdominal tenderness.  Musculoskeletal:        General: No edema.     Cervical back: Neck supple.  Neurological: He is alert and oriented to person, place, and time. He has intact cranial nerves (2-12).  Skin: Skin is warm and moist.   CARDIAC DATABASE: EKG: 12/22/2020: Sinus rhythm, 62 bpm, occasional PACs, right bundle branch block, without underlying injury pattern.  Echocardiogram: No results found for this or any previous visit from the past 1095 days.    Stress Testing: No results found for this or any previous visit from the past 1095 days.   Heart Catheterization: None  LABORATORY DATA: External Labs: Collected: 11/22/2021 Hemoglobin 12.9 g/dL, hematocrit 35.5%. BUN 24, creatinine 1.2. Sodium 144, potassium 4.5, chloride 107, bicarb 32. AST 19, ALT 19, alkaline phosphatase 68. TSH 3.05. Total cholesterol 160, triglyceride 173, HDL 54, LDL 77, non-HDL 106  IMPRESSION:    ICD-10-CM   1. Nonrheumatic aortic valve insufficiency  I35.1 ECHOCARDIOGRAM COMPLETE    2. Benign hypertension  I10 EKG 12-Lead       RECOMMENDATIONS: Terrence Hinton is a 88 y.o. Caucasian male whose past medical history and cardiac risk factors include: Aortic regurgitation, hypertension, former smoker, BPH.   Patient referred to the practice for evaluation and management of aortic regurgitation.  No history of rheumatic fever.  Echo was performed at an outside facility do not have the images or the report to review.   Requested records from referring physicians office and got an EKG faxed over.  We will reattempt to get records.  Currently he is asymptomatic.  Denies feeling short of breath, tired, fatigued, near-syncope or syncope.  His office blood pressures both at his PCPs office and our practice are not well controlled.  However, patient states that his home blood pressures are " much better."  I have asked him to be more cognizant of his home blood pressure readings and target a systolic blood pressure of 135 mmHg if able to tolerate.  I reemphasized importance of keeping a log of his blood pressures and to review it with either myself or PCP.  I requested that he be enrolled into remote patient monitoring for blood pressure but he refuses at this time.  We will plan an echocardiogram at the hospital in 6 months to reevaluate the severity of AR.  Data Reviewed: I have independently reviewed external notes provided by the referring provider as part of this office visit.   I have independently reviewed labs, ekg as part of medical decision making. I have ordered the following tests:  Orders Placed This Encounter  Procedures   EKG 12-Lead   ECHOCARDIOGRAM COMPLETE    Standing Status:   Future    Standing Expiration Date:   12/23/2022    Order Specific Question:   Where should this test be performed    Answer:   [facility]    Order Specific Question:   Perflutren DEFINITY (image enhancing agent) should be administered unless hypersensitivity or allergy exist    Answer:   Administer Perflutren    Order Specific Question:   Is a special reader required? (athlete or structural heart)    Answer:   No    Order Specific Question:   Reason for exam-Echo    Answer:   Aortic regurgitation I35.1    FINAL MEDICATION LIST END OF ENCOUNTER: No orders of the defined types were placed in this encounter.   Medications Discontinued During This Encounter  Medication Reason   tamsulosin (FLOMAX) 0.4 MG CAPS  capsule    levofloxacin (LEVAQUIN) 250 MG tablet    oxybutynin (DITROPAN) 5 MG tablet      Current Outpatient Medications:    glucosamine-chondroitin 500-400 MG tablet, Take 1 tablet by mouth 3 (three) times daily., Disp: , Rfl:    losartan (COZAAR) 25 MG tablet, Take 25 mg by mouth daily., Disp: , Rfl:    Multiple Vitamin (MULTIVITAMIN) capsule, Take 1 capsule by mouth daily., Disp: , Rfl:    Omega-3 Fatty Acids (FISH OIL) 1000 MG CAPS, Take by mouth., Disp: , Rfl:   Orders Placed This Encounter  Procedures   EKG 12-Lead   ECHOCARDIOGRAM COMPLETE    There are no Patient Instructions on file for this visit.   --Continue cardiac medications as reconciled in final medication list. --Return in about 7 months (around 07/11/2022) for Follow up aortic regurgitation, after echo. or sooner if needed. --Continue follow-up with your primary care physician regarding the management of your other chronic comorbid conditions.  Patient's questions and concerns were addressed to his satisfaction. He voices understanding of the instructions provided during this encounter.   This note was created using a voice recognition software as a result there may be grammatical errors inadvertently enclosed that do not reflect the nature of this encounter. Every attempt is made to correct such errors.  Rex Kras, [place], [facility]  Pager: [phone] Office: [phone]  ADDENDUM: Echo report from PCP office obtained at the end of the visit.  11/30/2021 LVEF 58%, moderate to severe AR, trace MR, trace TR, no pericardial effusion, no PHTN.

## 2022-01-03 DIAGNOSIS — I10 Essential (primary) hypertension: Secondary | ICD-10-CM | POA: Diagnosis not present

## 2022-03-15 DIAGNOSIS — R69 Illness, unspecified: Secondary | ICD-10-CM | POA: Diagnosis not present

## 2022-03-17 DIAGNOSIS — R69 Illness, unspecified: Secondary | ICD-10-CM | POA: Diagnosis not present

## 2022-03-22 DIAGNOSIS — R69 Illness, unspecified: Secondary | ICD-10-CM | POA: Diagnosis not present

## 2022-04-05 DIAGNOSIS — R69 Illness, unspecified: Secondary | ICD-10-CM | POA: Diagnosis not present

## 2022-04-07 DIAGNOSIS — R69 Illness, unspecified: Secondary | ICD-10-CM | POA: Diagnosis not present

## 2022-04-14 DIAGNOSIS — R69 Illness, unspecified: Secondary | ICD-10-CM | POA: Diagnosis not present

## 2022-04-19 DIAGNOSIS — R69 Illness, unspecified: Secondary | ICD-10-CM | POA: Diagnosis not present

## 2022-04-21 DIAGNOSIS — R69 Illness, unspecified: Secondary | ICD-10-CM | POA: Diagnosis not present

## 2022-04-25 DIAGNOSIS — M545 Low back pain, unspecified: Secondary | ICD-10-CM | POA: Diagnosis not present

## 2022-04-25 DIAGNOSIS — M25559 Pain in unspecified hip: Secondary | ICD-10-CM | POA: Diagnosis not present

## 2022-04-28 DIAGNOSIS — R69 Illness, unspecified: Secondary | ICD-10-CM | POA: Diagnosis not present

## 2022-05-01 ENCOUNTER — Ambulatory Visit
Admission: RE | Admit: 2022-05-01 | Discharge: 2022-05-01 | Disposition: A | Discharge status: Home / Self Care | Payer: Medicare HMO | Source: Ambulatory Visit | Attending: Internal Medicine | Admitting: Internal Medicine

## 2022-05-01 ENCOUNTER — Other Ambulatory Visit: Payer: Self-pay | Admitting: Internal Medicine

## 2022-05-01 DIAGNOSIS — M25551 Pain in right hip: Secondary | ICD-10-CM

## 2022-05-01 DIAGNOSIS — M545 Low back pain, unspecified: Secondary | ICD-10-CM

## 2022-05-03 DIAGNOSIS — R69 Illness, unspecified: Secondary | ICD-10-CM | POA: Diagnosis not present

## 2022-05-10 DIAGNOSIS — R69 Illness, unspecified: Secondary | ICD-10-CM | POA: Diagnosis not present

## 2022-05-12 DIAGNOSIS — R69 Illness, unspecified: Secondary | ICD-10-CM | POA: Diagnosis not present

## 2022-05-15 DIAGNOSIS — R69 Illness, unspecified: Secondary | ICD-10-CM | POA: Diagnosis not present

## 2022-05-16 DIAGNOSIS — M1611 Unilateral primary osteoarthritis, right hip: Secondary | ICD-10-CM | POA: Diagnosis not present

## 2022-05-16 DIAGNOSIS — M25551 Pain in right hip: Secondary | ICD-10-CM | POA: Diagnosis not present

## 2022-05-22 DIAGNOSIS — R69 Illness, unspecified: Secondary | ICD-10-CM | POA: Diagnosis not present

## 2022-05-26 DIAGNOSIS — R69 Illness, unspecified: Secondary | ICD-10-CM | POA: Diagnosis not present

## 2022-05-31 DIAGNOSIS — R69 Illness, unspecified: Secondary | ICD-10-CM | POA: Diagnosis not present

## 2022-06-02 DIAGNOSIS — R69 Illness, unspecified: Secondary | ICD-10-CM | POA: Diagnosis not present

## 2022-06-05 DIAGNOSIS — R69 Illness, unspecified: Secondary | ICD-10-CM | POA: Diagnosis not present

## 2022-06-09 DIAGNOSIS — R69 Illness, unspecified: Secondary | ICD-10-CM | POA: Diagnosis not present

## 2022-06-12 DIAGNOSIS — R69 Illness, unspecified: Secondary | ICD-10-CM | POA: Diagnosis not present

## 2022-06-16 DIAGNOSIS — R69 Illness, unspecified: Secondary | ICD-10-CM | POA: Diagnosis not present

## 2022-06-23 DIAGNOSIS — R69 Illness, unspecified: Secondary | ICD-10-CM | POA: Diagnosis not present

## 2022-06-28 DIAGNOSIS — R69 Illness, unspecified: Secondary | ICD-10-CM | POA: Diagnosis not present

## 2022-06-30 DIAGNOSIS — R69 Illness, unspecified: Secondary | ICD-10-CM | POA: Diagnosis not present

## 2022-07-11 ENCOUNTER — Ambulatory Visit: Payer: Medicare HMO | Admitting: Cardiology

## 2022-07-12 DIAGNOSIS — R69 Illness, unspecified: Secondary | ICD-10-CM | POA: Diagnosis not present

## 2022-07-14 DIAGNOSIS — R69 Illness, unspecified: Secondary | ICD-10-CM | POA: Diagnosis not present

## 2022-07-17 DIAGNOSIS — M79672 Pain in left foot: Secondary | ICD-10-CM | POA: Diagnosis not present

## 2022-07-17 DIAGNOSIS — M79671 Pain in right foot: Secondary | ICD-10-CM | POA: Diagnosis not present

## 2022-07-17 DIAGNOSIS — M1611 Unilateral primary osteoarthritis, right hip: Secondary | ICD-10-CM | POA: Diagnosis not present

## 2022-07-21 DIAGNOSIS — R69 Illness, unspecified: Secondary | ICD-10-CM | POA: Diagnosis not present

## 2022-07-25 DIAGNOSIS — I10 Essential (primary) hypertension: Secondary | ICD-10-CM | POA: Diagnosis not present

## 2022-07-25 DIAGNOSIS — M25559 Pain in unspecified hip: Secondary | ICD-10-CM | POA: Diagnosis not present

## 2022-07-25 DIAGNOSIS — I351 Nonrheumatic aortic (valve) insufficiency: Secondary | ICD-10-CM | POA: Diagnosis not present

## 2022-07-25 DIAGNOSIS — N1831 Chronic kidney disease, stage 3a: Secondary | ICD-10-CM | POA: Diagnosis not present

## 2022-07-28 DIAGNOSIS — R69 Illness, unspecified: Secondary | ICD-10-CM | POA: Diagnosis not present

## 2022-07-31 ENCOUNTER — Ambulatory Visit (HOSPITAL_COMMUNITY)
Admission: RE | Admit: 2022-07-31 | Discharge: 2022-07-31 | Disposition: A | Discharge status: Home / Self Care | Payer: Medicare HMO | Source: Ambulatory Visit | Attending: Cardiology | Admitting: Cardiology

## 2022-07-31 DIAGNOSIS — I351 Nonrheumatic aortic (valve) insufficiency: Secondary | ICD-10-CM | POA: Insufficient documentation

## 2022-07-31 NOTE — Progress Notes (Signed)
  Echocardiogram 2D Echocardiogram has been performed.  Terrence Hinton Wynn Banker 07/31/2022, 8:31 AM

## 2022-08-02 DIAGNOSIS — R69 Illness, unspecified: Secondary | ICD-10-CM | POA: Diagnosis not present

## 2022-08-04 DIAGNOSIS — I351 Nonrheumatic aortic (valve) insufficiency: Secondary | ICD-10-CM | POA: Insufficient documentation

## 2022-08-04 LAB — ECHOCARDIOGRAM COMPLETE
AR max vel: 3.22 cm2
AV Area VTI: 3.13 cm2
AV Area mean vel: 3.17 cm2
AV Mean grad: 5 mmHg
AV Peak grad: 9.1 mmHg
Ao pk vel: 1.51 m/s
Area-P 1/2: 2.34 cm2
Calc EF: 67.9 %
S' Lateral: 2.9 cm
Single Plane A2C EF: 68.1 %
Single Plane A4C EF: 67.4 %

## 2022-08-15 NOTE — Progress Notes (Signed)
Called patient no answer left a vm

## 2022-08-16 NOTE — Progress Notes (Signed)
Called patient, LMAM on VM.

## 2022-08-17 ENCOUNTER — Ambulatory Visit: Payer: Medicare HMO | Admitting: Cardiology

## 2022-08-17 ENCOUNTER — Encounter: Payer: Self-pay | Admitting: Cardiology

## 2022-08-17 VITALS — BP 159/58 | HR 48 | Ht 70.0 in | Wt 181.0 lb

## 2022-08-17 DIAGNOSIS — I10 Essential (primary) hypertension: Secondary | ICD-10-CM

## 2022-08-17 DIAGNOSIS — I351 Nonrheumatic aortic (valve) insufficiency: Secondary | ICD-10-CM

## 2022-08-17 NOTE — Progress Notes (Signed)
ID:  Terrence Hinton, DOB 07-09-1931, MRN [id number]  PCP:  Renford Dills, MD  Cardiologist:  Tessa Lerner, DO, [facility] (established care 12/22/2021)  Date: 08/17/22 Last Office Visit: 12/22/2021  Chief Complaint  Patient presents with   Nonrheumatic aortic valve insufficiency   Follow-up    HPI  Terrence Hinton is a 89 y.o. Caucasian male whose past medical history and cardiovascular risk factors include: Aortic regurgitation, hypertension, former smoker, BPH.   Patient was referred to the practice for evaluation of aortic regurgitation.  He had an echocardiogram at his PCPs office initially which noted moderate to severe aortic regurgitation and as result referred to cardiology.  At the last office visit the shared decision was to repeat echocardiogram prior to today's office visit and to focus on better blood pressure control at home.  Since last office visit, doing well from a cardiovascular standpoint.  He denies anginal chest pain or heart failure symptoms.  He had an echocardiogram at [facility] in May 2024 which notes preserved LVEF and mild aortic regurgitation.  He does not check his blood pressures at home the current time.  FUNCTIONAL STATUS: Golf several times a week and walks the dog everyday.    ALLERGIES: No Known Allergies  MEDICATION LIST PRIOR TO VISIT: Current Meds  Medication Sig   glucosamine-chondroitin 500-400 MG tablet Take 1 tablet by mouth 3 (three) times daily.   losartan (COZAAR) 25 MG tablet Take 25 mg by mouth daily.   Multiple Vitamin (MULTIVITAMIN) capsule Take 1 capsule by mouth daily.   Omega-3 Fatty Acids (FISH OIL) 1000 MG CAPS Take by mouth.     PAST MEDICAL HISTORY: Past Medical History:  Diagnosis Date   Anemia    Arthritis    hand hip   BPH with urinary obstruction    Foley catheter in place    Hypertension    Urinary retention    Wears glasses    Wears partial dentures    lower    PAST SURGICAL HISTORY: Past Surgical History:   Procedure Laterality Date   TONSILLECTOMY  1945   TRANSURETHRAL RESECTION OF PROSTATE N/A 03/17/2020   Procedure: TRANSURETHRAL RESECTION OF THE PROSTATE (TURP), BIPOLAR;  Surgeon: Rene Paci, MD;  Location: [facility];  Service: Urology;  Laterality: N/A;    FAMILY HISTORY: The patient family history includes Cancer in his father; Early death in his father; Heart attack in his mother.  SOCIAL HISTORY:  The patient  reports that he quit smoking about 44 years ago. His smoking use included cigarettes. He has never used smokeless tobacco. He reports that he does not currently use alcohol. He reports that he does not use drugs.  REVIEW OF SYSTEMS: Review of Systems  Cardiovascular:  Negative for chest pain, claudication, dyspnea on exertion, irregular heartbeat, leg swelling, near-syncope, orthopnea, palpitations, paroxysmal nocturnal dyspnea and syncope.  Respiratory:  Negative for shortness of breath.   Hematologic/Lymphatic: Negative for bleeding problem.  Musculoskeletal:  Negative for muscle cramps and myalgias.  Neurological:  Negative for dizziness and light-headedness.    PHYSICAL EXAM:    08/17/2022   11:50 AM 08/17/2022   11:42 AM 12/22/2021    8:55 AM  Vitals with BMI  Height  5\' 10"    Weight  181 lbs   BMI  25.97   Systolic 159 184 409  Diastolic 58 69 77  Pulse  48 52   Physical Exam  Constitutional: No distress.  Age appropriate, hemodynamically stable.   Neck:  No JVD present.  Cardiovascular: Normal rate, regular rhythm, S1 normal, S2 normal and intact distal pulses. Exam reveals no gallop, no S3 and no S4.  No murmur heard. Pulses:      Popliteal pulses are 2+ on the right side and 2+ on the left side.       Dorsalis pedis pulses are 1+ on the right side and 1+ on the left side.       Posterior tibial pulses are 2+ on the right side and 2+ on the left side.  Pulmonary/Chest: Effort normal and breath sounds normal. No stridor. He has  no wheezes. He has no rales.  Abdominal: Soft. Bowel sounds are normal. He exhibits no distension. There is no abdominal tenderness.  Musculoskeletal:        General: No edema.     Cervical back: Neck supple.  Neurological: He is alert and oriented to person, place, and time. He has intact cranial nerves (2-12).  Skin: Skin is warm and moist.   CARDIAC DATABASE: EKG: 12/22/2020: Sinus rhythm, 62 bpm, occasional PACs, right bundle branch block, without underlying injury pattern. August 17, 2022: Sinus bradycardia, 51 bpm, right bundle branch block, without underlying ischemia or injury pattern.   Echocardiogram: Echo report from PCP office  11/30/2021 LVEF 58%, moderate to severe AR, trace MR, trace TR, no pericardial effusion, no PHTN.   07/31/2022 1. Left ventricular ejection fraction, by estimation, is 60 to 65%. The left ventricle has normal function. The left ventricle has no regional wall motion abnormalities. Left ventricular diastolic parameters are consistent with Grade I diastolic  dysfunction (impaired relaxation). Elevated left ventricular end-diastolic pressure. 2. Right ventricular systolic function is normal. The right ventricular size is normal. 3. The mitral valve is normal in structure. Trivial mitral valve regurgitation. No evidence of mitral stenosis. 4. The aortic valve is tricuspid. Aortic valve regurgitation is mild. No aortic stenosis is present. 5. The inferior vena cava is normal in size with greater than 50% respiratory variability, suggesting right atrial pressure of 3 mmHg.    Stress Testing: No results found for this or any previous visit from the past 1095 days.   Heart Catheterization: None  LABORATORY DATA: External Labs: Collected: 11/22/2021 Hemoglobin 12.9 g/dL, hematocrit 16.1%. BUN 24, creatinine 1.2. Sodium 144, potassium 4.5, chloride 107, bicarb 32. AST 19, ALT 19, alkaline phosphatase 68. TSH 3.05. Total cholesterol 160, triglyceride 173,  HDL 54, LDL 77, non-HDL 106  IMPRESSION:    ICD-10-CM   1. Nonrheumatic aortic valve insufficiency  I35.1 EKG 12-Lead    2. Benign hypertension  I10        RECOMMENDATIONS: Terrence Hinton is a 89 y.o. Caucasian male whose past medical history and cardiac risk factors include: Aortic regurgitation, hypertension, former smoker, BPH.   Initially referred to the practice for evaluation of aortic regurgitation as an outside echo noted moderate to severe AR.  On physical examination it did not appear to be as severe and therefore the shared decision was to focus on blood pressure management and to repeat echocardiogram in May 2024.  Most recent echo from May 2024 notes mild AR with preserved LVEF.  Other details discussed with them at today's office visit.  Office blood pressures are not well-controlled.  He does not check his blood pressures at home.  I have motivated him to start checking them at home to keep a log to see if further medication titration is warranted.  Patient is agreeable with the plan of care.  He will review the blood pressure log with his PCP to see if medication titration is warranted.  Ideally would recommend a goal SBP of 130 mmHg.  Also educated him on the importance of being screened for other comorbid conditions such as diabetes, lipids and age-appropriate screening.  Patient will continue to follow with PCP regarding these matters.  Given his age and comorbidities that shared decision was to follow-up on annual basis after his yearly well visit with PCP.  He will bring his labs for review at that time.  He is more than welcome to come sooner if change in clinical status.  Thank you for allowing [place] to participate in the care of Terrence Hinton please reach out if any questions or concerns arise.  FINAL MEDICATION LIST END OF ENCOUNTER: No orders of the defined types were placed in this encounter.   There are no discontinued medications.    Current Outpatient  Medications:    glucosamine-chondroitin 500-400 MG tablet, Take 1 tablet by mouth 3 (three) times daily., Disp: , Rfl:    losartan (COZAAR) 25 MG tablet, Take 25 mg by mouth daily., Disp: , Rfl:    Multiple Vitamin (MULTIVITAMIN) capsule, Take 1 capsule by mouth daily., Disp: , Rfl:    Omega-3 Fatty Acids (FISH OIL) 1000 MG CAPS, Take by mouth., Disp: , Rfl:   Orders Placed This Encounter  Procedures   EKG 12-Lead    There are no Patient Instructions on file for this visit.   --Continue cardiac medications as reconciled in final medication list. --Return in about 1 year (around 08/17/2023) for Follow up valvular heart disease . or sooner if needed. --Continue follow-up with your primary care physician regarding the management of your other chronic comorbid conditions.  Patient's questions and concerns were addressed to his satisfaction. He voices understanding of the instructions provided during this encounter.   This note was created using a voice recognition software as a result there may be grammatical errors inadvertently enclosed that do not reflect the nature of this encounter. Every attempt is made to correct such errors.  Tessa Lerner, [place], [facility]  Pager:  [phone] Office: [phone]

## 2022-08-18 NOTE — Progress Notes (Signed)
3rd attempt : Called patient, NA, LMAM

## 2022-09-07 IMAGING — CT CT RENAL STONE PROTOCOL
2 of 4 series · 16 of 46 positions shown, 18 images · non-contrast
Comparison: None.

CLINICAL DATA: Flank pain, urinary retention

EXAM:
CT ABDOMEN AND PELVIS WITHOUT CONTRAST
TECHNIQUE: Multidetector CT imaging of the abdomen and pelvis was performed
following the standard protocol without IV contrast.

[Series 2: axial st · axial · 0.81mm/px · z∈[-632,-192]mm · 13 of 100 slices shown, 15 images]
[im 6/100  soft-tissue]
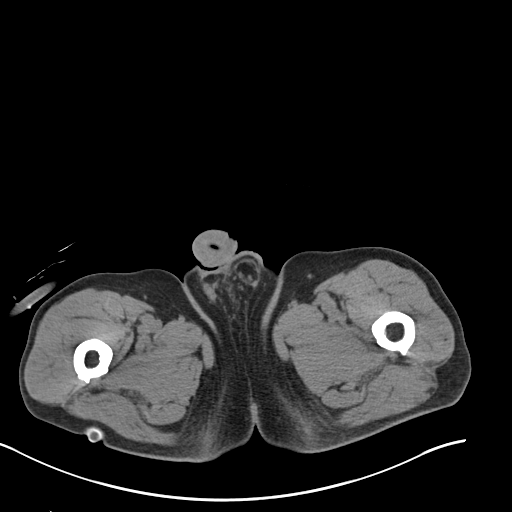
[im 6/100  bone]
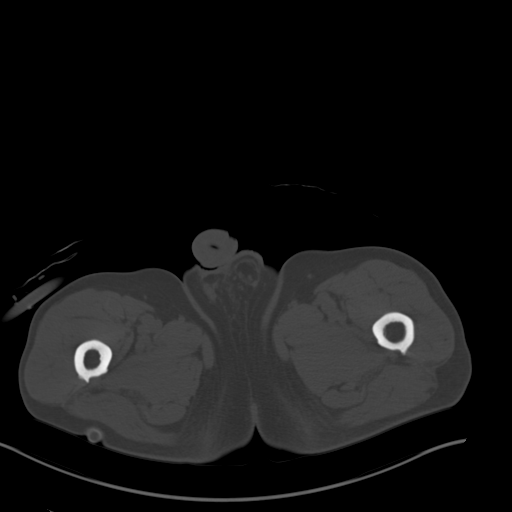
[im 16/100  soft-tissue]
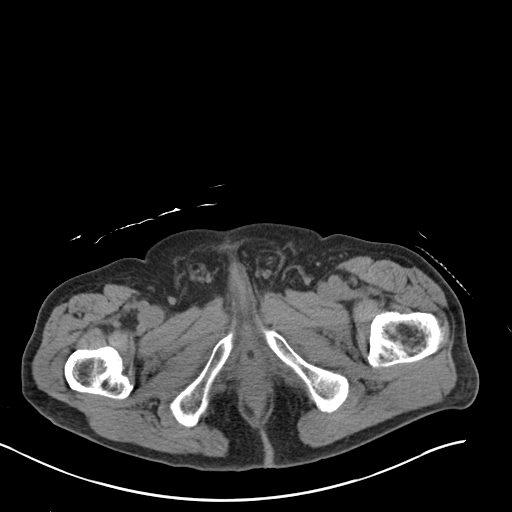
[im 21/100  soft-tissue]
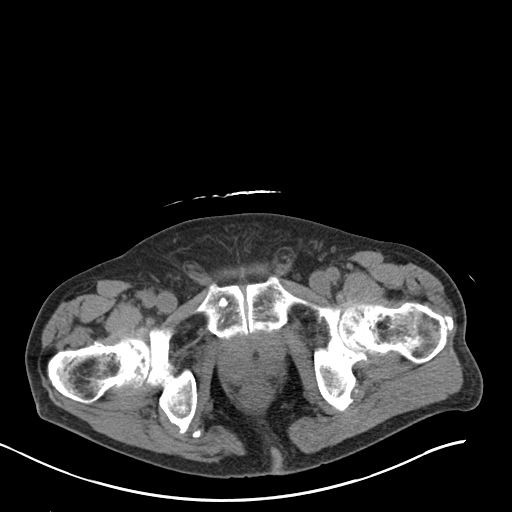
[im 27/100  soft-tissue]
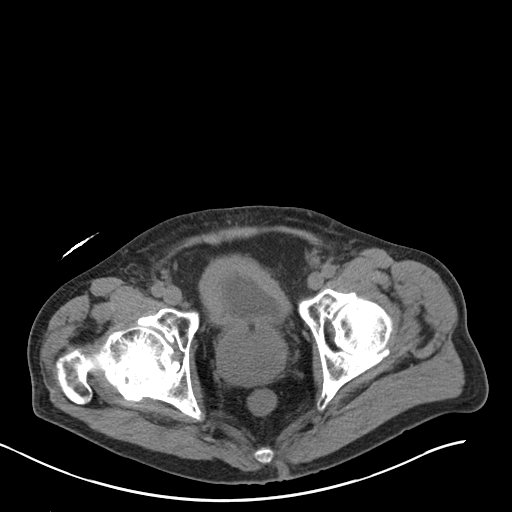
[im 37/100  soft-tissue]
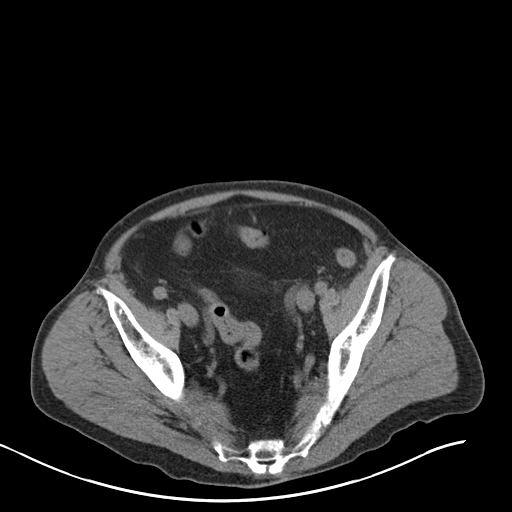
[im 42/100  soft-tissue]
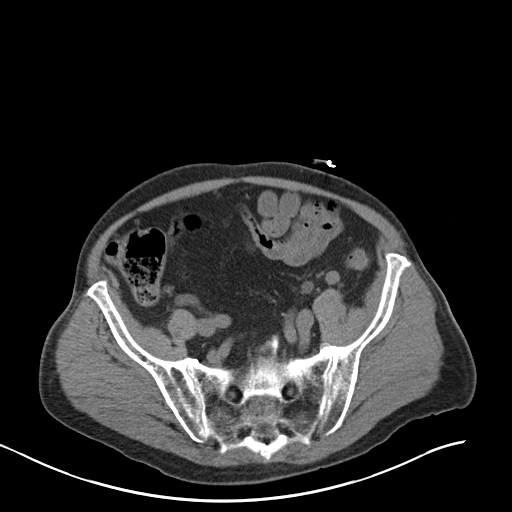
[im 53/100  soft-tissue]
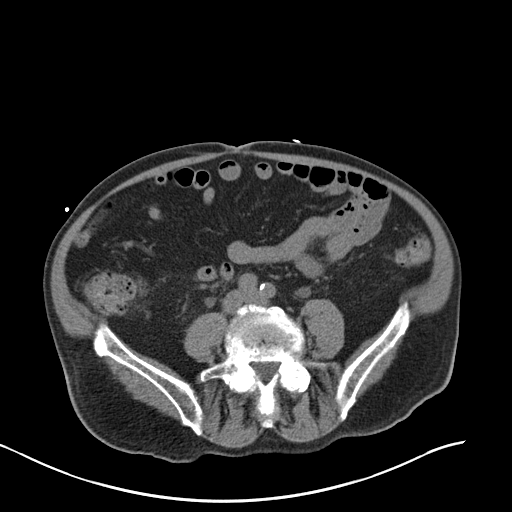
[im 58/100  soft-tissue]
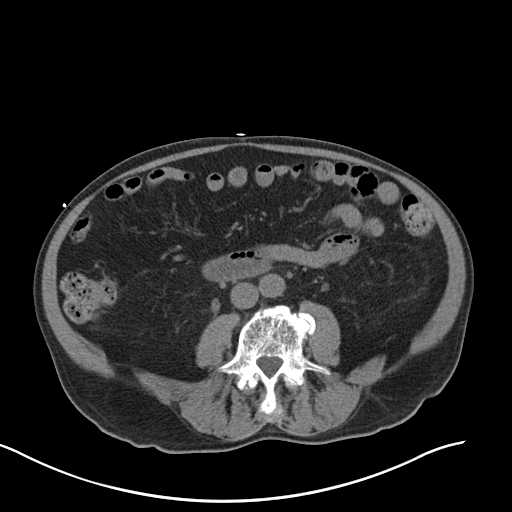
[im 63/100  soft-tissue]
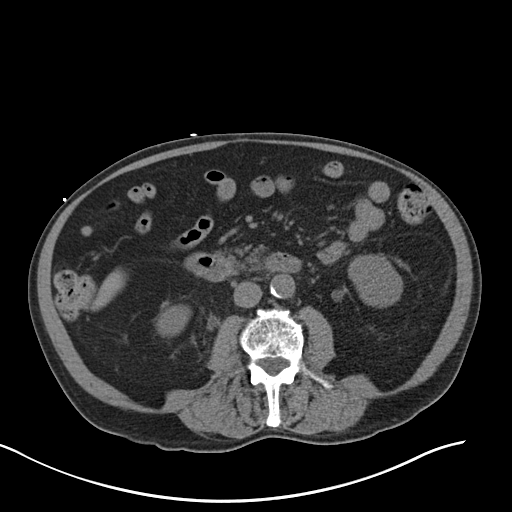
[im 63/100  bone]
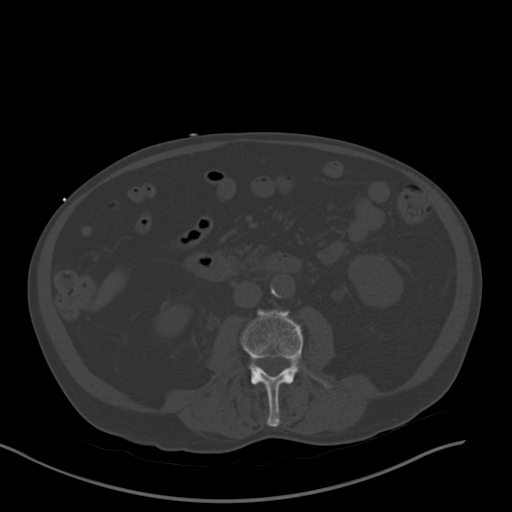
[im 73/100  soft-tissue]
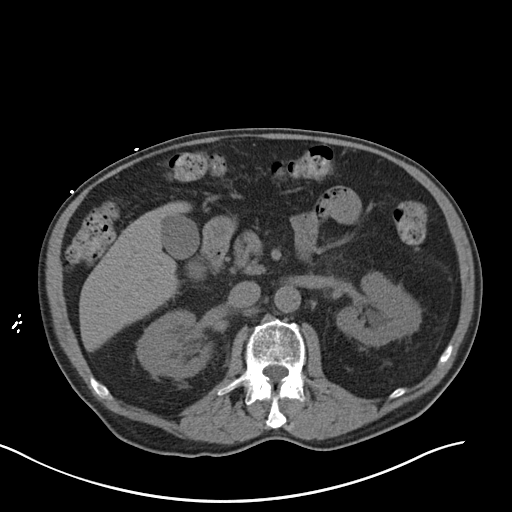
[im 79/100  soft-tissue]
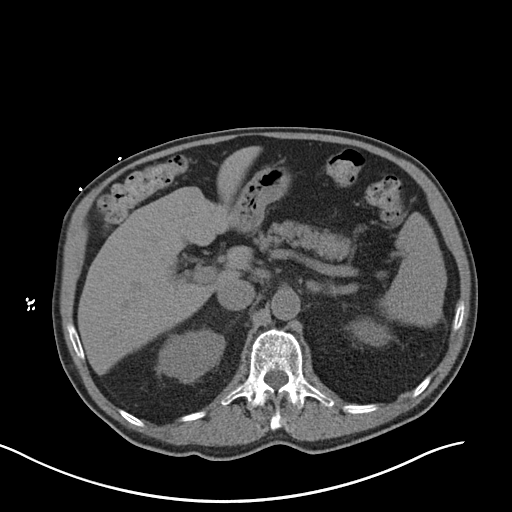
[im 84/100  soft-tissue]
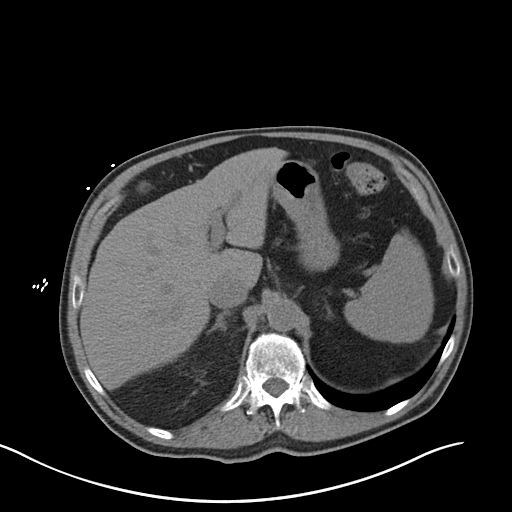
[im 94/100  soft-tissue]
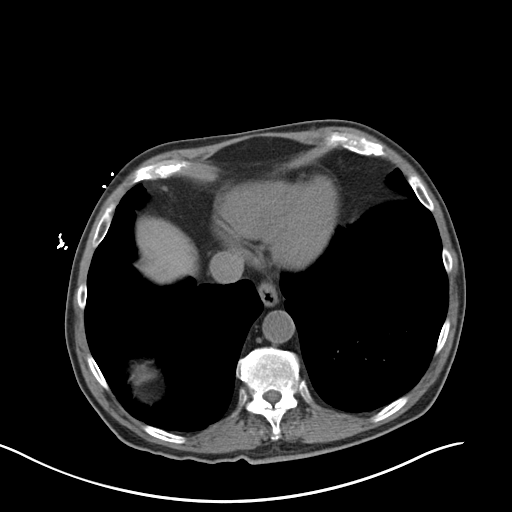

[Series 5: coronal · coronal · 0.83mm/px · 3 of 145 slices shown]
[im 49/145  soft-tissue]
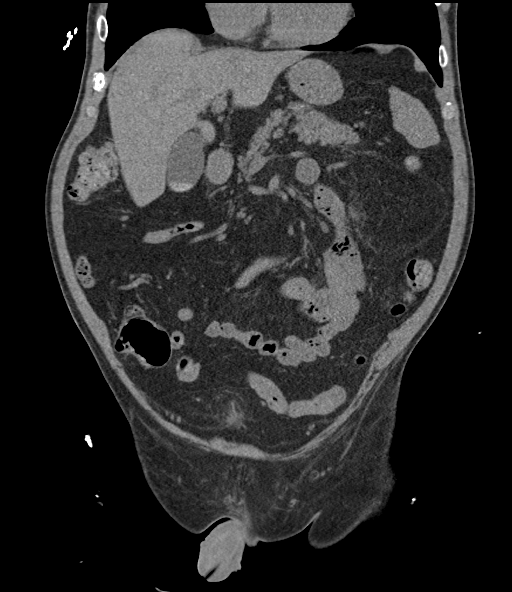
[im 65/145  soft-tissue]
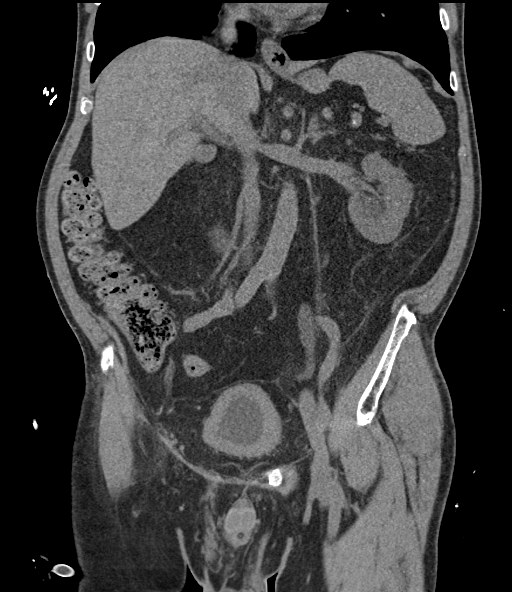
[im 81/145  soft-tissue]
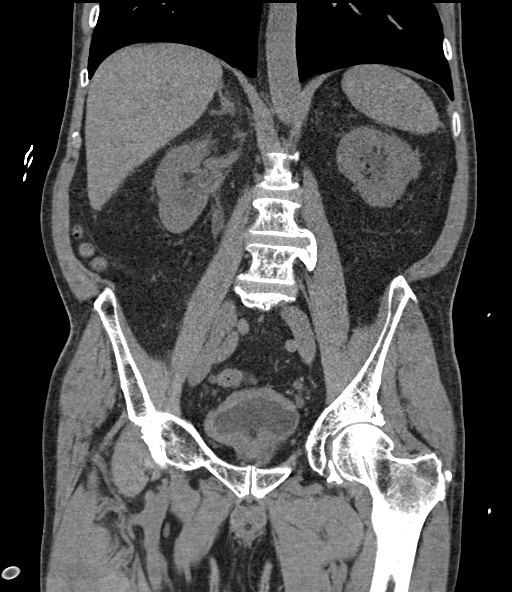

[16 of 46 positions shown; findings below may reference images not displayed]

FINDINGS: Lower chest: No acute abnormality.

Hepatobiliary: No focal liver lesion. Minimal layering hyperdensity
in the gallbladder may reflect sludge or tiny gallstones. No biliary
dilatation.

Pancreas: Unremarkable

Spleen: Unremarkable.

Adrenals/Urinary Tract: Bladder is partially decompressed by Foley
catheter with related intraluminal air. Bladder wall thickening
likely due to underdistention and chronic outlet obstruction. Right
greater than left hydroureteronephrosis. A calculus not identified.
Adrenals are unremarkable.

Stomach/Bowel: Stomach is within normal limits. Bowel is normal in
caliber.

Vascular/Lymphatic: Aortic atherosclerosis. No enlarged lymph nodes
identified.

Reproductive: Enlarged prostate.

Other: No ascites.  Abdominal wall is unremarkable.

Musculoskeletal: Degenerative changes of the included spine.
Degenerative changes of the hips, advanced on the right.
IMPRESSION: Right greater than left hydroureteronephrosis. No obstructing
calculus. Bladder wall thickening likely due to underdistention and
chronic outlet obstruction from prostate enlargement.

Minimal gallbladder sludge or layering gallstones.

Advanced right hip osteoarthritis.

Aortic atherosclerosis.

## 2022-10-10 DIAGNOSIS — M25551 Pain in right hip: Secondary | ICD-10-CM | POA: Diagnosis not present

## 2022-10-17 DIAGNOSIS — M25651 Stiffness of right hip, not elsewhere classified: Secondary | ICD-10-CM | POA: Diagnosis not present

## 2022-10-17 DIAGNOSIS — M1611 Unilateral primary osteoarthritis, right hip: Secondary | ICD-10-CM | POA: Diagnosis not present

## 2022-10-17 DIAGNOSIS — R531 Weakness: Secondary | ICD-10-CM | POA: Diagnosis not present

## 2022-10-24 DIAGNOSIS — R531 Weakness: Secondary | ICD-10-CM | POA: Diagnosis not present

## 2022-10-24 DIAGNOSIS — M1611 Unilateral primary osteoarthritis, right hip: Secondary | ICD-10-CM | POA: Diagnosis not present

## 2022-10-24 DIAGNOSIS — M25651 Stiffness of right hip, not elsewhere classified: Secondary | ICD-10-CM | POA: Diagnosis not present

## 2022-11-15 DIAGNOSIS — H6123 Impacted cerumen, bilateral: Secondary | ICD-10-CM | POA: Diagnosis not present

## 2023-01-04 DIAGNOSIS — M1611 Unilateral primary osteoarthritis, right hip: Secondary | ICD-10-CM | POA: Diagnosis not present

## 2023-01-04 DIAGNOSIS — M25551 Pain in right hip: Secondary | ICD-10-CM | POA: Diagnosis not present

## 2023-01-08 ENCOUNTER — Telehealth: Payer: Self-pay

## 2023-01-08 NOTE — Telephone Encounter (Signed)
Name: Terrence Hinton  DOB: 1931-07-06  MRN: [id number]  Primary Cardiologist: None   Preoperative team, please contact this patient and set up a phone call appointment for further preoperative risk assessment. Please obtain consent and complete medication review. Thank you for your help.  I confirm that guidance regarding antiplatelet and oral anticoagulation therapy has been completed and, if necessary, noted below.  None  I also confirmed the patient resides in the state of [place]. As per [facility] Medical Board telemedicine laws, the patient must reside in the state in which the provider is licensed.   Napoleon Form, Leodis Rains, NP 01/08/2023, 12:42 PM [facility]

## 2023-01-08 NOTE — Telephone Encounter (Signed)
...  Pre-operative Risk Assessment    Patient Name: Terrence Hinton  DOB: 02/10/1932 MRN: [id number]      Request for Surgical Clearance    Procedure:   RIGHT TOTAL HIP ARTHROPLASTY  Date of Surgery:  Clearance TBD                                 Surgeon:  DR Durene Romans Surgeon's Group or Practice Name:  [facility] Phone number:  [phone] Fax number:  [phone]   Type of Clearance Requested:   - Medical    Type of Anesthesia:  Spinal   Additional requests/questions:   RIGHT TOTAL HIP ARTHROPLASTY  [name]   01/08/2023, 12:23 PM

## 2023-01-09 ENCOUNTER — Telehealth: Payer: Self-pay | Admitting: *Deleted

## 2023-01-09 NOTE — Telephone Encounter (Signed)
Pt has been scheduled tele pre op appt 02/12/23. Pt tells me the surgery is planned for 02/27/23. Med rec and consent are done.      Patient Consent for Virtual Visit        Terrence Hinton has provided verbal consent on 01/09/2023 for a virtual visit (video or telephone).   CONSENT FOR VIRTUAL VISIT FOR:  Terrence Hinton  By participating in this virtual visit I agree to the following:  I hereby voluntarily request, consent and authorize [facility] and its employed or contracted physicians, physician assistants, nurse practitioners or other licensed health care professionals (the Practitioner), to provide me with telemedicine health care services (the "Services") as deemed necessary by the treating Practitioner. I acknowledge and consent to receive the Services by the Practitioner via telemedicine. I understand that the telemedicine visit will involve communicating with the Practitioner through live audiovisual communication technology and the disclosure of certain medical information by electronic transmission. I acknowledge that I have been given the opportunity to request an in-person assessment or other available alternative prior to the telemedicine visit and am voluntarily participating in the telemedicine visit.  I understand that I have the right to withhold or withdraw my consent to the use of telemedicine in the course of my care at any time, without affecting my right to future care or treatment, and that the Practitioner or I may terminate the telemedicine visit at any time. I understand that I have the right to inspect all information obtained and/or recorded in the course of the telemedicine visit and may receive copies of available information for a reasonable fee.  I understand that some of the potential risks of receiving the Services via telemedicine include:  Delay or interruption in medical evaluation due to technological equipment failure or disruption; Information  transmitted may not be sufficient (e.g. poor resolution of images) to allow for appropriate medical decision making by the Practitioner; and/or  In rare instances, security protocols could fail, causing a breach of personal health information.  Furthermore, I acknowledge that it is my responsibility to provide information about my medical history, conditions and care that is complete and accurate to the best of my ability. I acknowledge that Practitioner's advice, recommendations, and/or decision may be based on factors not within their control, such as incomplete or inaccurate data provided by me or distortions of diagnostic images or specimens that may result from electronic transmissions. I understand that the practice of medicine is not an exact science and that Practitioner makes no warranties or guarantees regarding treatment outcomes. I acknowledge that a copy of this consent can be made available to me via my patient portal [facility] MyChart), or I can request a printed copy by calling the office of  [facility].    I understand that my insurance will be billed for this visit.   I have read or had this consent read to me. I understand the contents of this consent, which adequately explains the benefits and risks of the Services being provided via telemedicine.  I have been provided ample opportunity to ask questions regarding this consent and the Services and have had my questions answered to my satisfaction. I give my informed consent for the services to be provided through the use of telemedicine in my medical care

## 2023-01-09 NOTE — Telephone Encounter (Signed)
Pt has been scheduled tele pre op appt 02/12/23. Pt tells me the surgery is planned for 02/27/23. Med rec and consent are done.

## 2023-01-23 DIAGNOSIS — I10 Essential (primary) hypertension: Secondary | ICD-10-CM | POA: Diagnosis not present

## 2023-01-23 DIAGNOSIS — N1831 Chronic kidney disease, stage 3a: Secondary | ICD-10-CM | POA: Diagnosis not present

## 2023-02-12 ENCOUNTER — Encounter: Payer: Self-pay | Admitting: Nurse Practitioner

## 2023-02-12 ENCOUNTER — Ambulatory Visit: Payer: Medicare HMO | Attending: Nurse Practitioner | Admitting: Nurse Practitioner

## 2023-02-12 DIAGNOSIS — Z01810 Encounter for preprocedural cardiovascular examination: Secondary | ICD-10-CM

## 2023-02-12 NOTE — Progress Notes (Signed)
Virtual Visit via Telephone Note   Because of Terrence Hinton's co-morbid illnesses, he is at least at moderate risk for complications without adequate follow up.  This format is felt to be most appropriate for this patient at this time.  The patient did not have access to video technology/had technical difficulties with video requiring transitioning to audio format only (telephone).  All issues noted in this document were discussed and addressed.  No physical exam could be performed with this format.  Please refer to the patient's chart for his consent to telehealth for Terrence Hinton.  Evaluation Performed:  Preoperative cardiovascular risk assessment _____________   Date:  02/12/2023   Patient ID:  Terrence Hinton, DOB 11-22-1931, MRN [id number] Patient Location:  Home Provider location:   Office  Primary Care Provider:  Renford Dills, MD Primary Cardiologist:  Terrence Lerner, DO  Chief Complaint / Patient Profile   89 y.o. y/o male with a h/o nonrheumatic aortic valve insufficiency, hypertension, former smoker, who is pending right total hip arthroplasty with Terrence Hinton, date TBD and presents today for telephonic preoperative cardiovascular risk assessment.  History of Present Illness    Terrence Hinton is a 89 y.o. male who presents via audio/video conferencing for a telehealth visit today.  Pt was last seen in cardiology clinic on 08/17/22 by Dr. Odis Hinton.  At that time Terrence Hinton was doing well.  The patient is now pending procedure as outlined above. Since his last visit, he denies chest pain, shortness of breath, lower extremity edema, fatigue, palpitations, melena, hematuria, hemoptysis, diaphoresis, weakness, presyncope, syncope, orthopnea, and PND. He is somewhat limited by hip pain but is able to achieve > 4 METS with walking and weight lifting.    Past Medical History    Past Medical History:  Diagnosis Date   Anemia    Arthritis    hand hip   BPH with urinary obstruction     Foley catheter in place    Hypertension    Urinary retention    Wears glasses    Wears partial dentures    lower   Past Surgical History:  Procedure Laterality Date   TONSILLECTOMY  1945   TRANSURETHRAL RESECTION OF PROSTATE N/A 03/17/2020   Procedure: TRANSURETHRAL RESECTION OF THE PROSTATE (TURP), BIPOLAR;  Surgeon: Terrence Paci, MD;  Location: [facility];  Service: Urology;  Laterality: N/A;    Allergies  No Known Allergies  Home Medications    Prior to Admission medications   Medication Sig Start Date End Date Taking? Authorizing Provider  glucosamine-chondroitin 500-400 MG tablet Take 1 tablet by mouth 3 (three) times daily. Patient not taking: Reported on 01/09/2023    [provider]  losartan (COZAAR) 25 MG tablet Take 25 mg by mouth daily. 12/06/21   [provider]  Multiple Vitamin (MULTIVITAMIN) capsule Take 1 capsule by mouth daily.    [provider]  Omega-3 Fatty Acids (FISH OIL) 1000 MG CAPS Take by mouth.    [provider]    Physical Exam    Vital Signs:  Terrence Hinton does not have vital signs available for review today.  Given telephonic nature of communication, physical exam is limited. AAOx3. NAD. Normal affect.  Speech and respirations are unlabored.  Accessory Clinical Findings    None  Assessment & Plan    1.  Preoperative Cardiovascular Risk Assessment: According to the Revised Cardiac Risk Index (RCRI), his Perioperative Risk of Major Cardiac Event is (%): 0.9. His Functional  Capacity in METs is: 6.61 according to the Duke Activity Status Index (DASI). The patient is doing well from a cardiac perspective. Therefore, based on ACC/AHA guidelines, the patient would be at acceptable risk for the planned procedure without further cardiovascular testing.   The patient was advised that if he develops new symptoms prior to surgery to contact our office to arrange for a follow-up visit, and he  verbalized understanding.  No request to hold cardiac medications.  A copy of this note will be routed to requesting surgeon.  Time:   Today, I have spent 10 minutes with the patient with telehealth technology discussing medical history, symptoms, and management plan.    Terrence Aland, NP-C  02/12/2023, 9:22 AM [address] Office [phone] Fax [phone]

## 2023-02-19 NOTE — Patient Instructions (Signed)
DUE TO COVID-19 ONLY TWO VISITORS  (aged 87 and older)  ARE ALLOWED TO COME WITH YOU AND STAY IN THE WAITING ROOM ONLY DURING PRE OP AND PROCEDURE.   **NO VISITORS ARE ALLOWED IN THE SHORT STAY AREA OR RECOVERY ROOM!!**  IF YOU WILL BE ADMITTED INTO THE HOSPITAL YOU ARE ALLOWED ONLY FOUR SUPPORT PEOPLE DURING VISITATION HOURS ONLY (7 AM -8PM)   The support person(s) must pass our screening, gel in and out, and wear a mask at all times, including in the patient's room. Patients must also wear a mask when staff or their support person are in the room. Visitors GUEST BADGE MUST BE WORN VISIBLY  One adult visitor may remain with you overnight and MUST be in the room by 8 P.M.     Your procedure is scheduled on: 02/27/23   Report to [facility] Main Entrance    Report to admitting at : 9:00 AM   Call this number if you have problems the morning of surgery [phone]   Do not eat food :After Midnight.   After Midnight you may have the following liquids until: 8:30 AM DAY OF SURGERY  Water Black Coffee (sugar ok, NO MILK/CREAM OR CREAMERS)  Tea (sugar ok, NO MILK/CREAM OR CREAMERS) regular and decaf                             Plain Jell-O (NO RED)                                           Fruit ices (not with fruit pulp, NO RED)                                     Popsicles (NO RED)                                                                  Juice: apple, WHITE grape, WHITE cranberry Sports drinks like Gatorade (NO RED)   The day of surgery:  Drink ONE (1) Pre-Surgery Clear Ensure at : 8:30 AM the morning of surgery. Drink in one sitting. Do not sip.  This drink was given to you during your hospital  pre-op appointment visit. Nothing else to drink after completing the  Pre-Surgery Clear Ensure or G2.          If you have questions, please contact your surgeon's office.  FOLLOW ANY ADDITIONAL PRE OP INSTRUCTIONS YOU RECEIVED FROM YOUR SURGEON'S OFFICE!!!   Oral  Hygiene is also important to reduce your risk of infection.                                    Remember - BRUSH YOUR TEETH THE MORNING OF SURGERY WITH YOUR REGULAR TOOTHPASTE  DENTURES WILL BE REMOVED PRIOR TO SURGERY PLEASE DO NOT APPLY "Poly grip" OR ADHESIVES!!!   Do NOT smoke after Midnight   Take these medicines the morning of surgery with  A SIP OF WATER: NONE.                              You may not have any metal on your body including hair pins, jewelry, and body piercing             Do not wear lotions, powders, perfumes/cologne, or deodorant              Men may shave face and neck.   Do not bring valuables to the hospital. [facility] IS NOT             RESPONSIBLE   FOR VALUABLES.   Contacts, glasses, or bridgework may not be worn into surgery.   Bring small overnight bag day of surgery.   DO NOT BRING YOUR HOME MEDICATIONS TO THE HOSPITAL. PHARMACY WILL DISPENSE MEDICATIONS LISTED ON YOUR MEDICATION LIST TO YOU DURING YOUR ADMISSION IN THE HOSPITAL!    Patients discharged on the day of surgery will not be allowed to drive home.  Someone NEEDS to stay with you for the first 24 hours after anesthesia.   Special Instructions: Bring a copy of your healthcare power of attorney and living will documents         the day of surgery if you haven't scanned them before.              Please read over the following fact sheets you were given: IF YOU HAVE QUESTIONS ABOUT YOUR PRE-OP INSTRUCTIONS PLEASE CALL [phone]      Pre-operative 5 CHG Bath Instructions   You can play a key role in reducing the risk of infection after surgery. Your skin needs to be as free of germs as possible. You can reduce the number of germs on your skin by washing with CHG (chlorhexidine gluconate) soap before surgery. CHG is an antiseptic soap that kills germs and continues to kill germs even after washing.   DO NOT use if you have an allergy to chlorhexidine/CHG or antibacterial soaps. If your  skin becomes reddened or irritated, stop using the CHG and notify one of our RNs at : [phone].   Please shower with the CHG soap starting 4 days before surgery using the following schedule:     Please keep in mind the following:  DO NOT shave, including legs and underarms, starting the day of your first shower.   You may shave your face at any point before/day of surgery.  Place clean sheets on your bed the day you start using CHG soap. Use a clean washcloth (not used since being washed) for each shower. DO NOT sleep with pets once you start using the CHG.   CHG Shower Instructions:  If you choose to wash your hair and private area, wash first with your normal shampoo/soap.  After you use shampoo/soap, rinse your hair and body thoroughly to remove shampoo/soap residue.  Turn the water OFF and apply about 3 tablespoons (45 ml) of CHG soap to a CLEAN washcloth.  Apply CHG soap ONLY FROM YOUR NECK DOWN TO YOUR TOES (washing for 3-5 minutes)  DO NOT use CHG soap on face, private areas, open wounds, or sores.  Pay special attention to the area where your surgery is being performed.  If you are having back surgery, having someone wash your back for you may be helpful. Wait 2 minutes after CHG soap is applied, then you may rinse off the  CHG soap.  Pat dry with a clean towel  Put on clean clothes/pajamas   If you choose to wear lotion, please use ONLY the CHG-compatible lotions on the back of this paper.     Additional instructions for the day of surgery: DO NOT APPLY any lotions, deodorants, cologne, or perfumes.   Put on clean/comfortable clothes.  Brush your teeth.  Ask your nurse before applying any prescription medications to the skin.   CHG Compatible Lotions   Aveeno Moisturizing lotion  Cetaphil Moisturizing Cream  Cetaphil Moisturizing Lotion  Clairol Herbal Essence Moisturizing Lotion, Dry Skin  Clairol Herbal Essence Moisturizing Lotion, Extra Dry Skin  Clairol Herbal  Essence Moisturizing Lotion, Normal Skin  Curel Age Defying Therapeutic Moisturizing Lotion with Alpha Hydroxy  Curel Extreme Care Body Lotion  Curel Soothing Hands Moisturizing Hand Lotion  Curel Therapeutic Moisturizing Cream, Fragrance-Free  Curel Therapeutic Moisturizing Lotion, Fragrance-Free  Curel Therapeutic Moisturizing Lotion, Original Formula  Eucerin Daily Replenishing Lotion  Eucerin Dry Skin Therapy Plus Alpha Hydroxy Crme  Eucerin Dry Skin Therapy Plus Alpha Hydroxy Lotion  Eucerin Original Crme  Eucerin Original Lotion  Eucerin Plus Crme Eucerin Plus Lotion  Eucerin TriLipid Replenishing Lotion  Keri Anti-Bacterial Hand Lotion  Keri Deep Conditioning Original Lotion Dry Skin Formula Softly Scented  Keri Deep Conditioning Original Lotion, Fragrance Free Sensitive Skin Formula  Keri Lotion Fast Absorbing Fragrance Free Sensitive Skin Formula  Keri Lotion Fast Absorbing Softly Scented Dry Skin Formula  Keri Original Lotion  Keri Skin Renewal Lotion Keri Silky Smooth Lotion  Keri Silky Smooth Sensitive Skin Lotion  Nivea Body Creamy Conditioning Oil  Nivea Body Extra Enriched Lotion  Nivea Body Original Lotion  Nivea Body Sheer Moisturizing Lotion Nivea Crme  Nivea Skin Firming Lotion  NutraDerm 30 Skin Lotion  NutraDerm Skin Lotion  NutraDerm Therapeutic Skin Cream  NutraDerm Therapeutic Skin Lotion  ProShield Protective Hand Cream  Provon moisturizing lotion   Incentive Spirometer  An incentive spirometer is a tool that can help keep your lungs clear and active. This tool measures how well you are filling your lungs with each breath. Taking long deep breaths may help reverse or decrease the chance of developing breathing (pulmonary) problems (especially infection) following: A long period of time when you are unable to move or be active. BEFORE THE PROCEDURE  If the spirometer includes an indicator to show your best effort, your nurse or respiratory therapist  will set it to a desired goal. If possible, sit up straight or lean slightly forward. Try not to slouch. Hold the incentive spirometer in an upright position. INSTRUCTIONS FOR USE  Sit on the edge of your bed if possible, or sit up as far as you can in bed or on a chair. Hold the incentive spirometer in an upright position. Breathe out normally. Place the mouthpiece in your mouth and seal your lips tightly around it. Breathe in slowly and as deeply as possible, raising the piston or the ball toward the top of the column. Hold your breath for 3-5 seconds or for as long as possible. Allow the piston or ball to fall to the bottom of the column. Remove the mouthpiece from your mouth and breathe out normally. Rest for a few seconds and repeat Steps 1 through 7 at least 10 times every 1-2 hours when you are awake. Take your time and take a few normal breaths between deep breaths. The spirometer may include an indicator to show your best effort. Use the indicator as  a goal to work toward during each repetition. After each set of 10 deep breaths, practice coughing to be sure your lungs are clear. If you have an incision (the cut made at the time of surgery), support your incision when coughing by placing a pillow or rolled up towels firmly against it. Once you are able to get out of bed, walk around indoors and cough well. You may stop using the incentive spirometer when instructed by your caregiver.  RISKS AND COMPLICATIONS Take your time so you do not get dizzy or light-headed. If you are in pain, you may need to take or ask for pain medication before doing incentive spirometry. It is harder to take a deep breath if you are having pain. AFTER USE Rest and breathe slowly and easily. It can be helpful to keep track of a log of your progress. Your caregiver can provide you with a simple table to help with this. If you are using the spirometer at home, follow these instructions: SEEK MEDICAL CARE IF:   You are having difficultly using the spirometer. You have trouble using the spirometer as often as instructed. Your pain medication is not giving enough relief while using the spirometer. You develop fever of 100.5 F (38.1 C) or higher. SEEK IMMEDIATE MEDICAL CARE IF:  You cough up bloody sputum that had not been present before. You develop fever of 102 F (38.9 C) or greater. You develop worsening pain at or near the incision site. MAKE SURE YOU:  Understand these instructions. Will watch your condition. Will get help right away if you are not doing well or get worse. Document Released: 07/10/2006 Document Revised: 05/22/2011 Document Reviewed: 09/10/2006 [facility] Patient Information 2014 [place], [place].   ________________________________________________________________________

## 2023-02-20 ENCOUNTER — Other Ambulatory Visit: Payer: Self-pay

## 2023-02-20 ENCOUNTER — Encounter (HOSPITAL_COMMUNITY): Payer: Self-pay

## 2023-02-20 ENCOUNTER — Encounter (HOSPITAL_COMMUNITY)
Admission: RE | Admit: 2023-02-20 | Discharge: 2023-02-20 | Disposition: A | Discharge status: Home / Self Care | Payer: Medicare HMO | Source: Ambulatory Visit | Attending: Orthopedic Surgery

## 2023-02-20 VITALS — BP 159/64 | HR 62 | Temp 97.6°F | Ht 70.0 in | Wt 179.0 lb

## 2023-02-20 DIAGNOSIS — E785 Hyperlipidemia, unspecified: Secondary | ICD-10-CM | POA: Diagnosis not present

## 2023-02-20 DIAGNOSIS — Z87891 Personal history of nicotine dependence: Secondary | ICD-10-CM | POA: Diagnosis not present

## 2023-02-20 DIAGNOSIS — I10 Essential (primary) hypertension: Secondary | ICD-10-CM | POA: Diagnosis not present

## 2023-02-20 DIAGNOSIS — R030 Elevated blood-pressure reading, without diagnosis of hypertension: Secondary | ICD-10-CM

## 2023-02-20 DIAGNOSIS — Z01812 Encounter for preprocedural laboratory examination: Secondary | ICD-10-CM | POA: Insufficient documentation

## 2023-02-20 DIAGNOSIS — Z01818 Encounter for other preprocedural examination: Secondary | ICD-10-CM

## 2023-02-20 DIAGNOSIS — M1611 Unilateral primary osteoarthritis, right hip: Secondary | ICD-10-CM

## 2023-02-20 LAB — CBC
HCT: 37.2 % — ABNORMAL LOW (ref 39.0–52.0)
Hemoglobin: 12.2 g/dL — ABNORMAL LOW (ref 13.0–17.0)
MCH: 32.6 pg (ref 26.0–34.0)
MCHC: 32.8 g/dL (ref 30.0–36.0)
MCV: 99.5 fL (ref 80.0–100.0)
Platelets: 151 10*3/uL (ref 150–400)
RBC: 3.74 MIL/uL — ABNORMAL LOW (ref 4.22–5.81)
RDW: 13.5 % (ref 11.5–15.5)
WBC: 4.8 10*3/uL (ref 4.0–10.5)
nRBC: 0 % (ref 0.0–0.2)

## 2023-02-20 LAB — BASIC METABOLIC PANEL
Anion gap: 8 (ref 5–15)
BUN: 27 mg/dL — ABNORMAL HIGH (ref 8–23)
CO2: 26 mmol/L (ref 22–32)
Calcium: 9 mg/dL (ref 8.9–10.3)
Chloride: 107 mmol/L (ref 98–111)
Creatinine, Ser: 1.14 mg/dL (ref 0.61–1.24)
GFR, Estimated: >60 mL/min (ref >60)
Glucose, Bld: 109 mg/dL — ABNORMAL HIGH (ref 70–99)
Potassium: 3.8 mmol/L (ref 3.5–5.1)
Sodium: 141 mmol/L (ref 135–145)

## 2023-02-20 LAB — SURGICAL PCR SCREEN
MRSA, PCR: NEGATIVE
Staphylococcus aureus: NEGATIVE

## 2023-02-20 NOTE — Progress Notes (Addendum)
For Anesthesia: PCP - Renford Dills, MD  Cardiologist - Odis Hollingshead, Sunit, DO . LOV: 08/17/22 Clearance: Eligha Bridegroom: NP: 02/12/23 Bowel Prep reminder:  Chest x-ray -  EKG - 08/17/22 Stress Test -  ECHO - 07/31/22 Cardiac Cath -  Pacemaker/ICD device last checked: Pacemaker orders received: Device Rep notified:  Spinal Cord Stimulator: N/A  Sleep Study - N/A CPAP -   Fasting Blood Sugar - N/A Checks Blood Sugar _____ times a day Date and result of last Hgb A1c-  Last dose of GLP1 agonist- N/A GLP1 instructions:   Last dose of SGLT-2 inhibitors- N/A SGLT-2 instructions:   Blood Thinner Instructions:N/A Aspirin Instructions: Last Dose:  Activity level: Can go up a flight of stairs and activities of daily living without stopping and without chest pain and/or shortness of breath   Able to exercise without chest pain and/or shortness of breath  Anesthesia review: Hx: HTN,Aortic regurgitation   Patient denies shortness of breath, fever, cough and chest pain at PAT appointment   Patient verbalized understanding of instructions that were given to them at the PAT appointment. Patient was also instructed that they will need to review over the PAT instructions again at home before surgery.

## 2023-02-21 ENCOUNTER — Encounter (HOSPITAL_COMMUNITY): Payer: Self-pay

## 2023-02-21 NOTE — Anesthesia Preprocedure Evaluation (Addendum)
Anesthesia Evaluation  Patient identified by MRN, date of birth, ID band Patient awake    Reviewed: Allergy & Precautions, NPO status , Patient's Chart, lab work & pertinent test results  Airway Mallampati: III  TM Distance: >3 FB Neck ROM: Full    Dental  (+) Dental Advisory Given, Missing   Pulmonary former smoker   Pulmonary exam normal breath sounds clear to auscultation       Cardiovascular hypertension, Normal cardiovascular exam+ Valvular Problems/Murmurs (mild AI) AI  Rhythm:Regular Rate:Normal  TTE 2024 1. Left ventricular ejection fraction, by estimation, is 60 to 65%. The  left ventricle has normal function. The left ventricle has no regional  wall motion abnormalities. Left ventricular diastolic parameters are  consistent with Grade I diastolic  dysfunction (impaired relaxation). Elevated left ventricular end-diastolic  pressure.   2. Right ventricular systolic function is normal. The right ventricular  size is normal.   3. The mitral valve is normal in structure. Trivial mitral valve  regurgitation. No evidence of mitral stenosis.   4. The aortic valve is tricuspid. Aortic valve regurgitation is mild. No  aortic stenosis is present.   5. The inferior vena cava is normal in size with greater than 50%  respiratory variability, suggesting right atrial pressure of 3 mmHg.     Neuro/Psych negative neurological ROS  negative psych ROS   GI/Hepatic negative GI ROS, Neg liver ROS,,,  Endo/Other  negative endocrine ROS    Renal/GU negative Renal ROS  negative genitourinary   Musculoskeletal  (+) Arthritis ,    Abdominal   Peds  Hematology negative hematology ROS (+)   Anesthesia Other Findings   Reproductive/Obstetrics                             Anesthesia Physical Anesthesia Plan  ASA: 2  Anesthesia Plan: Spinal   Post-op Pain Management: Ofirmev IV (intra-op)*    Induction:   PONV Risk Score and Plan: 1 and Treatment may vary due to age or medical condition, Ondansetron, Dexamethasone and Propofol infusion  Airway Management Planned: Natural Airway  Additional Equipment:   Intra-op Plan:   Post-operative Plan:   Informed Consent: I have reviewed the patients History and Physical, chart, labs and discussed the procedure including the risks, benefits and alternatives for the proposed anesthesia with the patient or authorized representative who has indicated his/her understanding and acceptance.     Dental advisory given  Plan Discussed with: CRNA  Anesthesia Plan Comments: (See PAT note from 12/10 by Sherlie Ban PA-C )        Anesthesia Quick Evaluation

## 2023-02-21 NOTE — Progress Notes (Signed)
DISCUSSION: Terrence Hinton is an 89 yr old male who presents to PAT prior to R THA on 12/17 with Dr. Charlann Boxer. PMH of former smoking, aortic insufficiency, HTN, HLD, BPH s/p TURP.  Patient saw Cardiology on 08/17/22 due to an abnormal echo report at his PCP's office which showed moderate to severe AI. Echo was repeated and only showed mild AI. Patient is without any cardiac symptoms. He was cleared for surgery:  "Preoperative Cardiovascular Risk Assessment: According to the Revised Cardiac Risk Index (RCRI), his Perioperative Risk of Major Cardiac Event is (%): 0.9. His Functional Capacity in METs is: 6.61 according to the Duke Activity Status Index (DASI). The patient is doing well from a cardiac perspective. Therefore, based on ACC/AHA guidelines, the patient would be at acceptable risk for the planned procedure without further cardiovascular testing."  VS: BP (!) 159/64   Pulse 62   Temp 36.4 C (Oral)   Ht 5\' 10"  (1.778 m)   Wt 81.2 kg   SpO2 96%   BMI 25.68 kg/m   PROVIDERS: Renford Dills, MD Cardiology: Tessa Lerner, MD  LABS: Labs reviewed: Acceptable for surgery. (all labs ordered are listed, but only abnormal results are displayed)  Labs Reviewed  BASIC METABOLIC PANEL - Abnormal; Notable for the following components:      Result Value   Glucose, Bld 109 (*)    BUN 27 (*)    All other components within normal limits  CBC - Abnormal; Notable for the following components:   RBC 3.74 (*)    Hemoglobin 12.2 (*)    HCT 37.2 (*)    All other components within normal limits  SURGICAL PCR SCREEN  TYPE AND SCREEN     IMAGES:   EKG:   CV:  Echo 07/31/22:  IMPRESSIONS    1. Left ventricular ejection fraction, by estimation, is 60 to 65%. The left ventricle has normal function. The left ventricle has no regional wall motion abnormalities. Left ventricular diastolic parameters are consistent with Grade I diastolic dysfunction (impaired relaxation). Elevated left ventricular  end-diastolic pressure.  2. Right ventricular systolic function is normal. The right ventricular size is normal.  3. The mitral valve is normal in structure. Trivial mitral valve regurgitation. No evidence of mitral stenosis.  4. The aortic valve is tricuspid. Aortic valve regurgitation is mild. No aortic stenosis is present.  5. The inferior vena cava is normal in size with greater than 50% respiratory variability, suggesting right atrial pressure of 3 mmHg.   Past Medical History:  Diagnosis Date   Anemia    Arthritis    hand hip   BPH with urinary obstruction    Foley catheter in place    Hypertension    Urinary retention    Wears glasses    Wears partial dentures    lower    Past Surgical History:  Procedure Laterality Date   BLEPHAROPLASTY Bilateral    CATARACT EXTRACTION, BILATERAL Bilateral    TONSILLECTOMY  1945   TRANSURETHRAL RESECTION OF PROSTATE N/A 03/17/2020   Procedure: TRANSURETHRAL RESECTION OF THE PROSTATE (TURP), BIPOLAR;  Surgeon: Rene Paci, MD;  Location: [facility];  Service: Urology;  Laterality: N/A;    MEDICATIONS:  Cholecalciferol (VITAMIN D3) 50 MCG (2000 UT) capsule   losartan (COZAAR) 25 MG tablet   Multiple Vitamin (MULTIVITAMIN) capsule   Omega-3 Fatty Acids (FISH OIL) 1200 MG CAPS   No current facility-administered medications for this encounter.   Ubaldo Glassing, PA-C [facility] Surgical Short  Stay/Anesthesiology [facility] Phone [phone] 02/21/2023 1:02 PM

## 2023-02-27 ENCOUNTER — Ambulatory Visit (HOSPITAL_COMMUNITY): Payer: Medicare HMO | Admitting: Medical

## 2023-02-27 ENCOUNTER — Other Ambulatory Visit: Payer: Self-pay

## 2023-02-27 ENCOUNTER — Observation Stay (HOSPITAL_COMMUNITY)
Admission: RE | Admit: 2023-02-27 | Discharge: 2023-02-28 | Disposition: A | Discharge status: Home / Self Care | Payer: Medicare HMO | Source: Ambulatory Visit | Attending: Orthopedic Surgery | Admitting: Orthopedic Surgery

## 2023-02-27 ENCOUNTER — Encounter (HOSPITAL_COMMUNITY): Payer: Self-pay | Admitting: Orthopedic Surgery

## 2023-02-27 ENCOUNTER — Observation Stay (HOSPITAL_COMMUNITY): Payer: Medicare HMO

## 2023-02-27 ENCOUNTER — Ambulatory Visit (HOSPITAL_COMMUNITY): Payer: Medicare HMO | Admitting: Anesthesiology

## 2023-02-27 ENCOUNTER — Encounter (HOSPITAL_COMMUNITY)
Admission: RE | Disposition: A | Discharge status: Home / Self Care | Payer: Self-pay | Source: Ambulatory Visit | Attending: Orthopedic Surgery

## 2023-02-27 ENCOUNTER — Ambulatory Visit (HOSPITAL_COMMUNITY): Payer: Medicare HMO

## 2023-02-27 DIAGNOSIS — Z79899 Other long term (current) drug therapy: Secondary | ICD-10-CM | POA: Diagnosis not present

## 2023-02-27 DIAGNOSIS — M1611 Unilateral primary osteoarthritis, right hip: Principal | ICD-10-CM | POA: Insufficient documentation

## 2023-02-27 DIAGNOSIS — Z96641 Presence of right artificial hip joint: Secondary | ICD-10-CM | POA: Diagnosis not present

## 2023-02-27 DIAGNOSIS — I10 Essential (primary) hypertension: Secondary | ICD-10-CM | POA: Insufficient documentation

## 2023-02-27 DIAGNOSIS — Z87891 Personal history of nicotine dependence: Secondary | ICD-10-CM | POA: Diagnosis not present

## 2023-02-27 HISTORY — PX: TOTAL HIP ARTHROPLASTY: SHX124

## 2023-02-27 LAB — TYPE AND SCREEN
ABO/RH(D): A NEG
Antibody Screen: NEGATIVE

## 2023-02-27 LAB — ABO/RH: ABO/RH(D): A NEG

## 2023-02-27 SURGERY — ARTHROPLASTY, HIP, TOTAL, ANTERIOR APPROACH
Anesthesia: Spinal | Site: Hip | Laterality: Right

## 2023-02-27 MED ORDER — POLYETHYLENE GLYCOL 3350 17 G PO PACK
17.0000 g | PACK | Freq: Two times a day (BID) | ORAL | Status: DC
  Administered 2023-02-27 – 2023-02-28 (×2): 17 g via ORAL
  Filled 2023-02-27 (×2): qty 1

## 2023-02-27 MED ORDER — BUPIVACAINE-EPINEPHRINE (PF) 0.25% -1:200000 IJ SOLN
INTRAMUSCULAR | Status: DC | PRN
  Administered 2023-02-27: 30 mL

## 2023-02-27 MED ORDER — DIPHENHYDRAMINE HCL 12.5 MG/5ML PO ELIX: 12.5000 mg | ORAL_SOLUTION | ORAL | Status: DC | PRN

## 2023-02-27 MED ORDER — METOCLOPRAMIDE HCL 5 MG PO TABS: 5.0000 mg | ORAL_TABLET | Freq: Three times a day (TID) | ORAL | Status: DC | PRN

## 2023-02-27 MED ORDER — SENNA 8.6 MG PO TABS
2.0000 | ORAL_TABLET | Freq: Every day | ORAL | Status: DC
  Administered 2023-02-27: 17.2 mg via ORAL
  Filled 2023-02-27: qty 2

## 2023-02-27 MED ORDER — SODIUM CHLORIDE (PF) 0.9 % IJ SOLN
INTRAMUSCULAR | Status: DC | PRN
  Administered 2023-02-27: 30 mL

## 2023-02-27 MED ORDER — SODIUM CHLORIDE (PF) 0.9 % IJ SOLN
INTRAMUSCULAR | Status: AC
  Filled 2023-02-27: qty 30

## 2023-02-27 MED ORDER — BISACODYL 10 MG RE SUPP: 10.0000 mg | Freq: Every day | RECTAL | Status: DC | PRN

## 2023-02-27 MED ORDER — TRANEXAMIC ACID-NACL 1000-0.7 MG/100ML-% IV SOLN
1000.0000 mg | INTRAVENOUS | Status: AC
  Administered 2023-02-27: 1000 mg via INTRAVENOUS
  Filled 2023-02-27: qty 100

## 2023-02-27 MED ORDER — FENTANYL CITRATE (PF) 100 MCG/2ML IJ SOLN
INTRAMUSCULAR | Status: AC
  Filled 2023-02-27: qty 2

## 2023-02-27 MED ORDER — PROPOFOL 1000 MG/100ML IV EMUL
INTRAVENOUS | Status: AC
  Filled 2023-02-27: qty 100

## 2023-02-27 MED ORDER — ONDANSETRON HCL 4 MG/2ML IJ SOLN
INTRAMUSCULAR | Status: DC | PRN
  Administered 2023-02-27: 4 mg via INTRAVENOUS

## 2023-02-27 MED ORDER — PHENYLEPHRINE HCL (PRESSORS) 10 MG/ML IV SOLN
INTRAVENOUS | Status: AC
  Filled 2023-02-27: qty 1

## 2023-02-27 MED ORDER — EPHEDRINE SULFATE (PRESSORS) 50 MG/ML IJ SOLN
INTRAMUSCULAR | Status: DC | PRN
  Administered 2023-02-27 (×4): 5 mg via INTRAVENOUS

## 2023-02-27 MED ORDER — DEXAMETHASONE SODIUM PHOSPHATE 10 MG/ML IJ SOLN: 8.0000 mg | Freq: Once | INTRAMUSCULAR | Status: DC

## 2023-02-27 MED ORDER — KETOROLAC TROMETHAMINE 30 MG/ML IJ SOLN
INTRAMUSCULAR | Status: DC | PRN
  Administered 2023-02-27: 30 mg

## 2023-02-27 MED ORDER — METOCLOPRAMIDE HCL 5 MG/ML IJ SOLN: 5.0000 mg | Freq: Three times a day (TID) | INTRAMUSCULAR | Status: DC | PRN

## 2023-02-27 MED ORDER — ORAL CARE MOUTH RINSE: 15.0000 mL | Freq: Once | OROMUCOSAL | Status: AC

## 2023-02-27 MED ORDER — BUPIVACAINE-EPINEPHRINE 0.25% -1:200000 IJ SOLN
INTRAMUSCULAR | Status: AC
  Filled 2023-02-27: qty 1

## 2023-02-27 MED ORDER — SODIUM CHLORIDE 0.9% FLUSH
10.0000 mL | Freq: Two times a day (BID) | INTRAVENOUS | Status: DC
  Administered 2023-02-28: 10 mL via INTRAVENOUS

## 2023-02-27 MED ORDER — ACETAMINOPHEN 500 MG PO TABS: 1000.0000 mg | ORAL_TABLET | Freq: Once | ORAL | Status: DC

## 2023-02-27 MED ORDER — SODIUM CHLORIDE 0.9% FLUSH: 10.0000 mL | Freq: Two times a day (BID) | INTRAVENOUS | Status: DC

## 2023-02-27 MED ORDER — FENTANYL CITRATE PF 50 MCG/ML IJ SOSY: 25.0000 ug | PREFILLED_SYRINGE | INTRAMUSCULAR | Status: DC | PRN

## 2023-02-27 MED ORDER — DEXAMETHASONE SODIUM PHOSPHATE 10 MG/ML IJ SOLN
INTRAMUSCULAR | Status: DC | PRN
  Administered 2023-02-27: 8 mg via INTRAVENOUS

## 2023-02-27 MED ORDER — CEFAZOLIN SODIUM-DEXTROSE 2-4 GM/100ML-% IV SOLN
2.0000 g | INTRAVENOUS | Status: AC
  Administered 2023-02-27: 2 g via INTRAVENOUS
  Filled 2023-02-27: qty 100

## 2023-02-27 MED ORDER — HYDROMORPHONE HCL 1 MG/ML IJ SOLN: 0.5000 mg | INTRAMUSCULAR | Status: DC | PRN

## 2023-02-27 MED ORDER — PHENOL 1.4 % MT LIQD: 1.0000 | OROMUCOSAL | Status: DC | PRN

## 2023-02-27 MED ORDER — DEXAMETHASONE SODIUM PHOSPHATE 10 MG/ML IJ SOLN
10.0000 mg | Freq: Once | INTRAMUSCULAR | Status: AC
  Administered 2023-02-28: 10 mg via INTRAVENOUS
  Filled 2023-02-27: qty 1

## 2023-02-27 MED ORDER — KETOROLAC TROMETHAMINE 30 MG/ML IJ SOLN
INTRAMUSCULAR | Status: AC
  Filled 2023-02-27: qty 1

## 2023-02-27 MED ORDER — METHOCARBAMOL 500 MG PO TABS
500.0000 mg | ORAL_TABLET | Freq: Four times a day (QID) | ORAL | Status: DC | PRN
  Administered 2023-02-27: 500 mg via ORAL
  Filled 2023-02-27: qty 1

## 2023-02-27 MED ORDER — ASPIRIN 81 MG PO CHEW
81.0000 mg | CHEWABLE_TABLET | Freq: Two times a day (BID) | ORAL | Status: DC
  Administered 2023-02-27 – 2023-02-28 (×2): 81 mg via ORAL
  Filled 2023-02-27 (×2): qty 1

## 2023-02-27 MED ORDER — 0.9 % SODIUM CHLORIDE (POUR BTL) OPTIME
TOPICAL | Status: DC | PRN
  Administered 2023-02-27: 1000 mL

## 2023-02-27 MED ORDER — ACETAMINOPHEN 500 MG PO TABS
1000.0000 mg | ORAL_TABLET | Freq: Four times a day (QID) | ORAL | Status: DC
  Administered 2023-02-27 – 2023-02-28 (×4): 1000 mg via ORAL
  Filled 2023-02-27 (×4): qty 2

## 2023-02-27 MED ORDER — CEFAZOLIN SODIUM-DEXTROSE 2-4 GM/100ML-% IV SOLN
2.0000 g | Freq: Four times a day (QID) | INTRAVENOUS | Status: AC
  Administered 2023-02-27 (×2): 2 g via INTRAVENOUS
  Filled 2023-02-27 (×2): qty 100

## 2023-02-27 MED ORDER — TRAMADOL HCL 50 MG PO TABS
50.0000 mg | ORAL_TABLET | Freq: Four times a day (QID) | ORAL | Status: DC | PRN
  Filled 2023-02-27: qty 2

## 2023-02-27 MED ORDER — MELOXICAM 15 MG PO TABS
15.0000 mg | ORAL_TABLET | Freq: Every day | ORAL | Status: DC
  Administered 2023-02-27 – 2023-02-28 (×2): 15 mg via ORAL
  Filled 2023-02-27 (×2): qty 1

## 2023-02-27 MED ORDER — TRANEXAMIC ACID-NACL 1000-0.7 MG/100ML-% IV SOLN
1000.0000 mg | Freq: Once | INTRAVENOUS | Status: AC
  Administered 2023-02-27: 1000 mg via INTRAVENOUS
  Filled 2023-02-27: qty 100

## 2023-02-27 MED ORDER — POVIDONE-IODINE 10 % EX SWAB: 2.0000 | Freq: Once | CUTANEOUS | Status: DC

## 2023-02-27 MED ORDER — METHOCARBAMOL 1000 MG/10ML IJ SOLN
500.0000 mg | Freq: Four times a day (QID) | INTRAMUSCULAR | Status: DC | PRN
Start: 2023-02-27 — End: 2023-02-28

## 2023-02-27 MED ORDER — LACTATED RINGERS IV SOLN: INTRAVENOUS | Status: DC

## 2023-02-27 MED ORDER — BUPIVACAINE IN DEXTROSE 0.75-8.25 % IT SOLN
INTRATHECAL | Status: DC | PRN
  Administered 2023-02-27: 1.8 mL via INTRATHECAL

## 2023-02-27 MED ORDER — LIDOCAINE HCL (CARDIAC) PF 100 MG/5ML IV SOSY
PREFILLED_SYRINGE | INTRAVENOUS | Status: DC | PRN
  Administered 2023-02-27: 80 mg via INTRAVENOUS

## 2023-02-27 MED ORDER — PROPOFOL 10 MG/ML IV BOLUS
INTRAVENOUS | Status: AC
  Filled 2023-02-27: qty 20

## 2023-02-27 MED ORDER — LOSARTAN POTASSIUM 25 MG PO TABS
25.0000 mg | ORAL_TABLET | Freq: Every day | ORAL | Status: DC
Start: 2023-02-28 — End: 2023-02-28
  Administered 2023-02-28: 25 mg via ORAL
  Filled 2023-02-27: qty 1

## 2023-02-27 MED ORDER — CHLORHEXIDINE GLUCONATE 0.12 % MT SOLN
15.0000 mL | Freq: Once | OROMUCOSAL | Status: AC
  Administered 2023-02-27: 15 mL via OROMUCOSAL

## 2023-02-27 MED ORDER — ONDANSETRON HCL 4 MG/2ML IJ SOLN
4.0000 mg | Freq: Four times a day (QID) | INTRAMUSCULAR | Status: DC | PRN
Start: 2023-02-27 — End: 2023-02-28

## 2023-02-27 MED ORDER — PROPOFOL 10 MG/ML IV BOLUS
INTRAVENOUS | Status: DC | PRN
  Administered 2023-02-27: 75 ug/kg/min via INTRAVENOUS
  Administered 2023-02-27: 20 mg via INTRAVENOUS

## 2023-02-27 MED ORDER — ONDANSETRON HCL 4 MG PO TABS: 4.0000 mg | ORAL_TABLET | Freq: Four times a day (QID) | ORAL | Status: DC | PRN

## 2023-02-27 MED ORDER — FENTANYL CITRATE (PF) 100 MCG/2ML IJ SOLN
INTRAMUSCULAR | Status: DC | PRN
  Administered 2023-02-27 (×2): 50 ug via INTRAVENOUS

## 2023-02-27 MED ORDER — ALUM & MAG HYDROXIDE-SIMETH 200-200-20 MG/5ML PO SUSP
30.0000 mL | ORAL | Status: DC | PRN
Start: 2023-02-27 — End: 2023-02-28

## 2023-02-27 MED ORDER — EPHEDRINE 5 MG/ML INJ
INTRAVENOUS | Status: AC
  Filled 2023-02-27: qty 5

## 2023-02-27 MED ORDER — MENTHOL 3 MG MT LOZG: 1.0000 | LOZENGE | OROMUCOSAL | Status: DC | PRN

## 2023-02-27 SURGICAL SUPPLY — 38 items
BAG COUNTER SPONGE SURGICOUNT (BAG) IMPLANT
BAG ZIPLOCK 12X15 (MISCELLANEOUS) IMPLANT
BLADE SAG 18X100X1.27 (BLADE) ×1 IMPLANT
COVER PERINEAL POST (MISCELLANEOUS) ×1 IMPLANT
COVER SURGICAL LIGHT HANDLE (MISCELLANEOUS) ×1 IMPLANT
CUP ACET PINNACLE SECTR 58MM (Hips) IMPLANT
DERMABOND ADVANCED .7 DNX12 (GAUZE/BANDAGES/DRESSINGS) ×1 IMPLANT
DRAPE FOOT SWITCH (DRAPES) ×1 IMPLANT
DRAPE STERI IOBAN 125X83 (DRAPES) ×1 IMPLANT
DRAPE U-SHAPE 47X51 STRL (DRAPES) ×2 IMPLANT
DRESSING AQUACEL AG SP 3.5X10 (GAUZE/BANDAGES/DRESSINGS) ×1 IMPLANT
DRSG AQUACEL AG SP 3.5X10 (GAUZE/BANDAGES/DRESSINGS) ×1
DURAPREP 26ML APPLICATOR (WOUND CARE) ×1 IMPLANT
ELECT REM PT RETURN 15FT ADLT (MISCELLANEOUS) ×1 IMPLANT
GLOVE BIO SURGEON STRL SZ 6 (GLOVE) ×1 IMPLANT
GLOVE BIOGEL PI IND STRL 6.5 (GLOVE) ×1 IMPLANT
GLOVE BIOGEL PI IND STRL 7.5 (GLOVE) ×1 IMPLANT
GLOVE ORTHO TXT STRL SZ7.5 (GLOVE) ×2 IMPLANT
GOWN STRL REUS W/ TWL LRG LVL3 (GOWN DISPOSABLE) ×2 IMPLANT
HEAD M SROM 36MM PLUS 1.5 (Hips) IMPLANT
HOLDER FOLEY CATH W/STRAP (MISCELLANEOUS) ×1 IMPLANT
KIT TURNOVER KIT A (KITS) IMPLANT
LINER NEUTRAL 36X58 PLUS4 IMPLANT
NDL SAFETY ECLIPSE 18X1.5 (NEEDLE) IMPLANT
PACK ANTERIOR HIP CUSTOM (KITS) ×1 IMPLANT
PINNACLE SECTOR CUP 58MM (Hips) ×1 IMPLANT
SCREW 6.5MMX35MM (Screw) IMPLANT
SROM M HEAD 36MM PLUS 1.5 (Hips) ×1 IMPLANT
STEM FEM ACTIS HIGH SZ7 (Stem) IMPLANT
SUT MNCRL AB 4-0 PS2 18 (SUTURE) ×1 IMPLANT
SUT STRATAFIX 0 PDS 27 VIOLET (SUTURE) ×1
SUT VIC AB 1 CT1 36 (SUTURE) ×3 IMPLANT
SUT VIC AB 2-0 CT1 TAPERPNT 27 (SUTURE) ×2 IMPLANT
SUTURE STRATFX 0 PDS 27 VIOLET (SUTURE) ×1 IMPLANT
SYR 3ML LL SCALE MARK (SYRINGE) IMPLANT
TRAY FOLEY MTR SLVR 16FR STAT (SET/KITS/TRAYS/PACK) IMPLANT
TUBE SUCTION HIGH CAP CLEAR NV (SUCTIONS) ×1 IMPLANT
WATER STERILE IRR 1000ML POUR (IV SOLUTION) ×1 IMPLANT

## 2023-02-27 NOTE — Anesthesia Procedure Notes (Signed)
Spinal  Patient location during procedure: OR Start time: 02/27/2023 11:35 AM End time: 02/27/2023 11:37 AM Reason for block: surgical anesthesia Staffing Performed: anesthesiologist  Anesthesiologist: Elmer Picker, MD Performed by: Elmer Picker, MD Authorized by: Elmer Picker, MD   Preanesthetic Checklist Completed: patient identified, IV checked, risks and benefits discussed, surgical consent, monitors and equipment checked, pre-op evaluation and timeout performed Spinal Block Patient position: sitting Prep: DuraPrep and site prepped and draped Patient monitoring: cardiac monitor, continuous pulse ox and blood pressure Approach: midline Location: L3-4 Injection technique: single-shot Needle Needle type: Pencan  Needle gauge: 24 G Needle length: 9 cm Assessment Sensory level: T6 Events: CSF return Additional Notes Functioning IV was confirmed and monitors were applied. Sterile prep and drape, including hand hygiene and sterile gloves were used. The patient was positioned and the spine was prepped. The skin was anesthetized with lidocaine.  Free flow of clear CSF was obtained prior to injecting local anesthetic into the CSF.  The spinal needle aspirated freely following injection.  The needle was carefully withdrawn.  The patient tolerated the procedure well.

## 2023-02-27 NOTE — Anesthesia Postprocedure Evaluation (Signed)
Anesthesia Post Note  Patient: Cassian Liszewski  Procedure(s) Performed: TOTAL HIP ARTHROPLASTY ANTERIOR APPROACH (Right: Hip)     Patient location during evaluation: PACU Anesthesia Type: Spinal Level of consciousness: oriented and awake and alert Pain management: pain level controlled Vital Signs Assessment: post-procedure vital signs reviewed and stable Respiratory status: spontaneous breathing, respiratory function stable and patient connected to nasal cannula oxygen Cardiovascular status: blood pressure returned to baseline and stable Postop Assessment: no headache, no backache and no apparent nausea or vomiting Anesthetic complications: no  No notable events documented.  Last Vitals:  Vitals:   02/27/23 1430 02/27/23 1451  BP: 138/60 138/86  Pulse: (!) 54 (!) 58  Resp: 13 16  Temp: 36.7 C 36.4 C  SpO2: 96% 97%    Last Pain:  Vitals:   02/27/23 1451  TempSrc: Oral  PainSc:                  Zebedee Segundo L Wesam Gearhart

## 2023-02-27 NOTE — Interval H&P Note (Signed)
History and Physical Interval Note:  02/27/2023 9:33 AM  Terrence Hinton  has presented today for surgery, with the diagnosis of Right hip osteoarthritits.  The various methods of treatment have been discussed with the patient and family. After consideration of risks, benefits and other options for treatment, the patient has consented to  Procedure(s) with comments: TOTAL HIP ARTHROPLASTY ANTERIOR APPROACH (Right) - 90 as a surgical intervention.  The patient's history has been reviewed, patient examined, no change in status, stable for surgery.  I have reviewed the patient's chart and labs.  Questions were answered to the patient's satisfaction.     Shelda Pal

## 2023-02-27 NOTE — Op Note (Signed)
NAME:  Terrence Hinton                ACCOUNT NO.: [id number]      MEDICAL RECORD NO.: [id number]      FACILITY:  [facility]      PHYSICIAN:  Shelda Pal  DATE OF BIRTH:  12-19-1931     DATE OF PROCEDURE:  02/27/2023                                 OPERATIVE REPORT         PREOPERATIVE DIAGNOSIS: Right  hip osteoarthritis.      POSTOPERATIVE DIAGNOSIS:  Right hip osteoarthritis.      PROCEDURE:  Right total hip replacement through an anterior approach   utilizing DePuy THR system, component size 58 mm pinnacle cup, a size 36+4 neutral   Altrex liner, a size 7 Hi Actis stem with a 36+1.5 Articuleze metal head ball.      SURGEON:  Madlyn Frankel. Charlann Boxer, M.D.      ASSISTANT:  Rosalene Billings, PA-C     ANESTHESIA:  Spinal.      SPECIMENS:  None.      COMPLICATIONS:  None.      BLOOD LOSS:  500 cc     DRAINS:  None.      INDICATION OF THE PROCEDURE:  Terrence Hinton is a 89 y.o. male who had   presented to office for evaluation of right hip pain.  Radiographs revealed   progressive degenerative changes with bone-on-bone   articulation of the  hip joint, including subchondral cystic changes and osteophytes.  The patient had painful limited range of   motion significantly affecting their overall quality of life and function.  The patient was failing to    respond to conservative measures including medications and/or injections and activity modification and at this point was ready   to proceed with more definitive measures.  Consent was obtained for   benefit of pain relief.  Specific risks of infection, DVT, component   failure, dislocation, neurovascular injury, and need for revision surgery were reviewed in the office.     PROCEDURE IN DETAIL:  The patient was brought to operative theater.   Once adequate anesthesia, preoperative antibiotics, 2 gm of Ancef, 1 gm of Tranexamic Acid, and 10 mg of Decadron were administered, the patient was positioned supine on the [employer] table.  Once the patient was safely positioned with adequate padding of boney prominences we predraped out the hip, and used fluoroscopy to confirm orientation of the pelvis.      The right hip was then prepped and draped from proximal iliac crest to   mid thigh with a shower curtain technique.      Time-out was performed identifying the patient, planned procedure, and the appropriate extremity.     An incision was then made 2 cm lateral to the   anterior superior iliac spine extending over the orientation of the   tensor fascia lata muscle and sharp dissection was carried down to the   fascia of the muscle.      The fascia was then incised.  The muscle belly was identified and swept   laterally and retractor placed along the superior neck.  Following   cauterization of the circumflex vessels and removing some pericapsular   fat, a second cobra retractor was placed on the inferior neck.  A  T-capsulotomy was made along the line of the   superior neck to the trochanteric fossa, then extended proximally and   distally.  Tag sutures were placed and the retractors were then placed   intracapsular.  We then identified the trochanteric fossa and   orientation of my neck cut and then made a neck osteotomy with the femur on traction.  The femoral   head was removed without difficulty or complication.  Traction was let   off and retractors were placed posterior and anterior around the   acetabulum.      The labrum and foveal tissue were debrided.  I began reaming with a 48 mm   reamer and reamed up to 57 mm reamer with good bony bed preparation and a 58 mm  cup was chosen.  The final 58 mm Pinnacle cup was then impacted under fluoroscopy to confirm the depth of penetration and orientation with respect to   Abduction and forward flexion.  A screw was placed into the ilium followed by the hole eliminator.  The final   36+4 neutral Altrex liner was impacted with good visualized rim fit.  The cup  was positioned anatomically within the acetabular portion of the pelvis.      At this point, the femur was rolled to 100 degrees.  Further capsule was   released off the inferior aspect of the femoral neck.  I then   released the superior capsule proximally.  With the leg in a neutral position the hook was placed laterally   along the femur under the vastus lateralis origin and elevated manually and then held in position using the hook attachment on the bed.  The leg was then extended and adducted with the leg rolled to 100   degrees of external rotation.  Retractors were placed along the medial calcar and posteriorly over the greater trochanter.  Once the proximal femur was fully   exposed, I used a box osteotome to set orientation.  I then began   broaching with the starting chili pepper broach and passed this by hand and then broached up to 7.  With the 7 broach in place I chose a high offset neck and did several trial reductions.  The offset was appropriate, leg lengths   appeared to be equal best matched with the +1.5 head ball trial confirmed radiographically.   Given these findings, I went ahead and dislocated the hip, repositioned all   retractors and positioned the right hip in the extended and abducted position.  The final 7 Hi Actis stem was   chosen and it was impacted down to the level of neck cut.  Based on this   and the trial reductions, a final 36+1.5 Articuleze metal head ball was chosen and   impacted onto a clean and dry trunnion, and the hip was reduced.  The   hip had been irrigated throughout the case again at this point.  I did   reapproximate the superior capsular leaflet to the anterior leaflet   using #1 Vicryl.  The fascia of the   tensor fascia lata muscle was then reapproximated using #1 Vicryl and #0 Stratafix sutures.  The   remaining wound was closed with 2-0 Vicryl and running 4-0 Monocryl.   The hip was cleaned, dried, and dressed sterilely using Dermabond and    Aquacel dressing.  The patient was then brought   to recovery room in stable condition tolerating the procedure well.    Rosalene Billings, PA-C  was present for the entirety of the case involved from   preoperative positioning, perioperative retractor management, general   facilitation of the case, as well as primary wound closure as assistant.            Madlyn Frankel Charlann Boxer, M.D.        02/27/2023 9:33 AM

## 2023-02-27 NOTE — Evaluation (Signed)
Physical Therapy Evaluation Patient Details Name: Timberland Zehrung MRN: [id number] DOB: 1931/04/23 Today's Date: 02/27/2023  History of Present Illness  89 yo male presents to therapy s/p R THA, anterior approach on 02/27/2023 due to failure of conservative measures. Pt PMH includes but is not limited to: OA, elevated BP, aortic valve insufficiency, anemia, and BPH s/p TURP.  Clinical Impression      Ollen Pacheco is a 89 y.o. male POD 0 s/p R THA, AA. Patient reports IND with mobility at baseline. Patient is now limited by functional impairments (see PT problem list below) and requires CGA for bed mobility and CGA and cues for transfers. Patient was unable to safely ambulate at time of eval due to apparent symptomatic orthostatic hypotension episode with pt reporting light headedness and physiological response of diaphoresis.  Patient instructed in exercise to facilitate ROM and circulation to manage edema. Patient will benefit from continued skilled PT interventions to address impairments and progress towards PLOF. Acute PT will follow to progress mobility and stair training in preparation for safe discharge home with family support and HEP.      If plan is discharge home, recommend the following: A little help with walking and/or transfers;A little help with bathing/dressing/bathroom;Assistance with cooking/housework;Assist for transportation;Help with stairs or ramp for entrance   Can travel by private vehicle        Equipment Recommendations Rolling walker (2 wheels)  Recommendations for Other Services       Functional Status Assessment Patient has had a recent decline in their functional status and demonstrates the ability to make significant improvements in function in a reasonable and predictable amount of time.     Precautions / Restrictions Precautions Precautions: Fall (othostatic) Restrictions Weight Bearing Restrictions Per Provider Order: No      Mobility  Bed  Mobility Overal bed mobility: Needs Assistance Bed Mobility: Supine to Sit     Supine to sit: Contact guard, HOB elevated     General bed mobility comments: min cues for sliding R LE to EOB    Transfers Overall transfer level: Needs assistance Equipment used: Rolling walker (2 wheels) Transfers: Sit to/from Stand Sit to Stand: Contact guard assist, From elevated surface           General transfer comment: cues for safety with pt reporting slight dizziness seated EOB and no change initally when in standing, however pt presenation and apparent physiological change including diaphoresis is often indicative precursor to LOC along with pt reports of light headedness.    Ambulation/Gait               General Gait Details: NT for pt and staff safety  Stairs            Wheelchair Mobility     Tilt Bed    Modified Rankin (Stroke Patients Only)       Balance                                             Pertinent Vitals/Pain Pain Assessment Pain Assessment: 0-10 Pain Score: 2  Pain Location: R LE and hip Pain Descriptors / Indicators: Aching, Sore, Operative site guarding Pain Intervention(s): Limited activity within patient's tolerance, Monitored during session, Premedicated before session, Repositioned, Ice applied    Home Living Family/patient expects to be discharged to:: Private residence Living Arrangements: Spouse/significant other Available Help at Discharge: Family (  daughter whom is a Engineer, civil (consulting) can assist some at time of d/c) Type of Home: House (town home)       Alternate Teacher, music of Steps: flight Home Layout: Two level;Bed/bath upstairs Home Equipment: None      Prior Function Prior Level of Function : Independent/Modified Independent;Driving             Mobility Comments: IND no AD for all ADLs, self care tasks, IADLs       Extremity/Trunk Assessment        Lower Extremity Assessment Lower Extremity  Assessment: RLE deficits/detail RLE Deficits / Details: ankle DF/PF 5/5 RLE Sensation: WNL    Cervical / Trunk Assessment Cervical / Trunk Assessment: Normal  Communication   Communication Communication: Difficulty following commands/understanding  Cognition Arousal: Alert Behavior During Therapy: WFL for tasks assessed/performed Overall Cognitive Status: Impaired/Different from baseline Area of Impairment: Attention, Following commands, Safety/judgement, Awareness                       Following Commands: Follows one step commands with increased time Safety/Judgement: Decreased awareness of safety, Decreased awareness of deficits     General Comments: suspect medication, pt exhibited difficulty with short term recall during PT eval. pt had what appeared to be symtomatic orthostatic hypotension episode when in standing and exhibited diaphoresis and required increased time with multimodal cues for complete SPT from bed to recliner once in standing, once pt seated in recliner inquired as to why PT was not going to have pt amb at eval. PT provided ed and then later repeated along with use of call bell to await staff assist and pt later stated he was just to get up and go back to bed when he was ready, PT reitteraded use of call bell and importance of staff assist.        General Comments      Exercises Total Joint Exercises Ankle Circles/Pumps: AROM, Both, 10 reps   Assessment/Plan    PT Assessment Patient needs continued PT services  PT Problem List Decreased strength;Decreased range of motion;Decreased activity tolerance;Decreased balance;Decreased coordination;Decreased mobility;Pain;Decreased safety awareness       PT Treatment Interventions DME instruction;Gait training;Stair training;Functional mobility training;Therapeutic activities;Therapeutic exercise;Balance training;Neuromuscular re-education;Patient/family education;Modalities    PT Goals (Current goals can  be found in the Care Plan section)  Acute Rehab PT Goals Patient Stated Goal: get back on the golf course PT Goal Formulation: With patient Time For Goal Achievement: 03/13/23 Potential to Achieve Goals: Good    Frequency 7X/week     Co-evaluation               AM-PAC PT "6 Clicks" Mobility  Outcome Measure Help needed turning from your back to your side while in a flat bed without using bedrails?: None Help needed moving from lying on your back to sitting on the side of a flat bed without using bedrails?: A Little Help needed moving to and from a bed to a chair (including a wheelchair)?: A Little Help needed standing up from a chair using your arms (e.g., wheelchair or bedside chair)?: A Little Help needed to walk in hospital room?: Total Help needed climbing 3-5 steps with a railing? : Total 6 Click Score: 15    End of Session Equipment Utilized During Treatment: Gait belt Activity Tolerance: Treatment limited secondary to medical complications (Comment) (signs and symptoms of orthostatic hypotension) Patient left: in chair;with call bell/phone within reach;with chair alarm set Nurse Communication: Mobility status;Other (  comment) (pt phyisological response) PT Visit Diagnosis: Unsteadiness on feet (R26.81);Other abnormalities of gait and mobility (R26.89);Muscle weakness (generalized) (M62.81);Pain;Difficulty in walking, not elsewhere classified (R26.2) Pain - Right/Left: Right Pain - part of body: Leg;Hip    Time: 1610-9604 PT Time Calculation (min) (ACUTE ONLY): 27 min   Charges:   PT Evaluation $PT Eval Low Complexity: 1 Low PT Treatments $Therapeutic Activity: 8-22 mins PT General Charges $$ ACUTE PT VISIT: 1 Visit         Johnny Bridge, PT Acute Rehab   Jacqualyn Posey 02/27/2023, 7:22 PM

## 2023-02-27 NOTE — Care Plan (Signed)
Ortho Bundle Case Management Note  Patient Details  Name: Terrence Hinton MRN: [id number] Date of Birth: [birth year]                  R THA on 02/27/23.  DCP: Home with wife and daughter.  DME: RW ordered through [employer].  PT: HEP   DME Arranged:  Walker rolling DME Agency:  [employer]    Additional Comments: Please contact me with any questions of if this plan should need to change.    Despina Pole, CCM Case Manager, [facility] [phone] 02/27/2023, 11:11 AM

## 2023-02-27 NOTE — Discharge Instructions (Signed)

## 2023-02-27 NOTE — Transfer of Care (Signed)
Immediate Anesthesia Transfer of Care Note  Patient: Terrence Hinton  Procedure(s) Performed: Procedure(s) with comments: TOTAL HIP ARTHROPLASTY ANTERIOR APPROACH (Right) - 90  Patient Location: PACU  Anesthesia Type:Spinal  Level of Consciousness:  sedated, patient cooperative and responds to stimulation  Airway & Oxygen Therapy:Patient Spontanous Breathing and Patient connected to face mask oxgen  Post-op Assessment:  Report given to PACU RN and Post -op Vital signs reviewed and stable  Post vital signs:  Reviewed and stable  Last Vitals:  Vitals:   02/27/23 0926 02/27/23 0933  BP: (!) 178/76 (!) 161/74  Pulse: 60   Resp: 16   Temp: 36.8 C   SpO2: 96%     Complications: No apparent anesthesia complications

## 2023-02-27 NOTE — H&P (Signed)
TOTAL HIP ADMISSION H&P  Patient is admitted for right total hip arthroplasty.  Therapy Plans: HEP Disposition: Home with wife and daughter Planned DVT Prophylaxis: aspirin 81mg  BID DME needed: walker PCP: Dr. Nehemiah Settle - clearance received TXA: IV Allergies: NKDA Anesthesia Concerns: none BMI:26.2 Last HgbA1c: Not diabetic   Other: - tylenol, meloxicam, methocarbamol, tramadol - Felt very sleepy with tramadol - prefers minimal meds - No hx of VTE or cancer  Subjective:  Chief Complaint: right hip pain  HPI: Terrence Hinton, 89 y.o. male, has a history of pain and functional disability in the right hip(s) due to arthritis and patient has failed non-surgical conservative treatments for greater than 12 weeks to include NSAID's and/or analgesics, corticosteriod injections, and activity modification.  Onset of symptoms was gradual starting 2 years ago with gradually worsening course since that time.The patient noted no past surgery on the right hip(s).  Patient currently rates pain in the right hip at 8 out of 10 with activity. Patient has worsening of pain with activity and weight bearing, pain that interfers with activities of daily living, and pain with passive range of motion. Patient has evidence of joint space narrowing by imaging studies. This condition presents safety issues increasing the risk of falls.   There is no current active infection.  Patient Active Problem List   Diagnosis Date Noted   Nonrheumatic aortic valve insufficiency 08/04/2022   BPH with urinary obstruction 03/17/2020   BPH (benign prostatic hyperplasia) 02/10/2020   Osteoarthritis 12/26/2018   Elevated BP without diagnosis of hypertension 10/17/2017   Past Medical History:  Diagnosis Date   Anemia    Arthritis    hand hip   BPH with urinary obstruction    Foley catheter in place    Hypertension    Urinary retention    Wears glasses    Wears partial dentures    lower    Past Surgical History:   Procedure Laterality Date   BLEPHAROPLASTY Bilateral    CATARACT EXTRACTION, BILATERAL Bilateral    TONSILLECTOMY  1945   TRANSURETHRAL RESECTION OF PROSTATE N/A 03/17/2020   Procedure: TRANSURETHRAL RESECTION OF THE PROSTATE (TURP), BIPOLAR;  Surgeon: Rene Paci, MD;  Location: [facility];  Service: Urology;  Laterality: N/A;    No current facility-administered medications for this encounter.   Current Outpatient Medications  Medication Sig Dispense Refill Last Dose/Taking   Cholecalciferol (VITAMIN D3) 50 MCG (2000 UT) capsule Take 2,000 Units by mouth daily.   Taking   losartan (COZAAR) 25 MG tablet Take 25 mg by mouth daily.   Taking   Multiple Vitamin (MULTIVITAMIN) capsule Take 1 capsule by mouth daily. Centrum Silver   Taking   Omega-3 Fatty Acids (FISH OIL) 1200 MG CAPS Take 2,400 mg by mouth daily.   Taking   No Known Allergies  Social History   Tobacco Use   Smoking status: Former    Current packs/day: 0.00    Types: Cigarettes    Start date: 10/17/1957    Quit date: 10/17/1977    Years since quitting: 45.3   Smokeless tobacco: Never  Substance Use Topics   Alcohol use: Not Currently    Family History  Problem Relation Age of Onset   Heart attack Mother    Cancer Father    Early death Father      Review of Systems  Constitutional:  Negative for chills and fever.  Respiratory:  Negative for cough and shortness of breath.   Cardiovascular:  Negative  for chest pain.  Gastrointestinal:  Negative for nausea and vomiting.  Musculoskeletal:  Positive for arthralgias.     Objective:  Physical Exam Well nourished and well developed. General: Alert and oriented x3, cooperative and pleasant, no acute distress. Head: normocephalic, atraumatic, neck supple. Eyes: EOMI.  Musculoskeletal: Right hip exam: He has pain and limited hip flexion internal rotation to 5 degrees with pelvic tilting, external rotation to 20 degrees Active hip  flexion with noted external rotation contracture Neurovascular intact distally without lower extremity edema or erythema or calf tenderness   Calves soft and nontender. Motor function intact in LE. Strength 5/5 LE bilaterally. Neuro: Distal pulses 2+. Sensation to light touch intact in LE.  Vital signs in last 24 hours:    Labs:   Estimated body mass index is 25.68 kg/m as calculated from the following:   Height as of 02/20/23: 5\' 10"  (1.778 m).   Weight as of 02/20/23: 81.2 kg.   Imaging Review Plain radiographs demonstrate severe degenerative joint disease of the right hip(s). The bone quality appears to be adequate for age and reported activity level.      Assessment/Plan:  End stage arthritis, right hip(s)  The patient history, physical examination, clinical judgement of the provider and imaging studies are consistent with end stage degenerative joint disease of the right hip(s) and total hip arthroplasty is deemed medically necessary. The treatment options including medical management, injection therapy, arthroscopy and arthroplasty were discussed at length. The risks and benefits of total hip arthroplasty were presented and reviewed. The risks due to aseptic loosening, infection, stiffness, dislocation/subluxation,  thromboembolic complications and other imponderables were discussed.  The patient acknowledged the explanation, agreed to proceed with the plan and consent was signed. Patient is being admitted for inpatient treatment for surgery, pain control, PT, OT, prophylactic antibiotics, VTE prophylaxis, progressive ambulation and ADL's and discharge planning.The patient is planning to be discharged  home.   Rosalene Billings, PA-C Orthopedic Surgery [facility] [place] [phone]

## 2023-02-27 NOTE — Plan of Care (Signed)
  Problem: Education: Goal: Knowledge of General Education information will improve Description: Including pain rating scale, medication(s)/side effects and non-pharmacologic comfort measures Outcome: Progressing   Problem: Clinical Measurements: Goal: Cardiovascular complication will be avoided Outcome: Progressing   Problem: Activity: Goal: Risk for activity intolerance will decrease Outcome: Progressing   Problem: Nutrition: Goal: Adequate nutrition will be maintained Outcome: Progressing   Problem: Elimination: Goal: Will not experience complications related to bowel motility Outcome: Progressing   Problem: Pain Management: Goal: General experience of comfort will improve Outcome: Progressing   Problem: Safety: Goal: Ability to remain free from injury will improve Outcome: Progressing

## 2023-02-28 ENCOUNTER — Encounter (HOSPITAL_COMMUNITY): Payer: Self-pay | Admitting: Orthopedic Surgery

## 2023-02-28 DIAGNOSIS — M1611 Unilateral primary osteoarthritis, right hip: Secondary | ICD-10-CM | POA: Diagnosis not present

## 2023-02-28 LAB — BASIC METABOLIC PANEL
Anion gap: 8 (ref 5–15)
BUN: 33 mg/dL — ABNORMAL HIGH (ref 8–23)
CO2: 23 mmol/L (ref 22–32)
Calcium: 8.3 mg/dL — ABNORMAL LOW (ref 8.9–10.3)
Chloride: 105 mmol/L (ref 98–111)
Creatinine, Ser: 1.35 mg/dL — ABNORMAL HIGH (ref 0.61–1.24)
GFR, Estimated: 50 mL/min — ABNORMAL LOW (ref >60)
Glucose, Bld: 185 mg/dL — ABNORMAL HIGH (ref 70–99)
Potassium: 4 mmol/L (ref 3.5–5.1)
Sodium: 136 mmol/L (ref 135–145)

## 2023-02-28 LAB — CBC
HCT: 26.6 % — ABNORMAL LOW (ref 39.0–52.0)
Hemoglobin: 8.8 g/dL — ABNORMAL LOW (ref 13.0–17.0)
MCH: 32.8 pg (ref 26.0–34.0)
MCHC: 33.1 g/dL (ref 30.0–36.0)
MCV: 99.3 fL (ref 80.0–100.0)
Platelets: 120 10*3/uL — ABNORMAL LOW (ref 150–400)
RBC: 2.68 MIL/uL — ABNORMAL LOW (ref 4.22–5.81)
RDW: 13.6 % (ref 11.5–15.5)
WBC: 11.7 10*3/uL — ABNORMAL HIGH (ref 4.0–10.5)
nRBC: 0 % (ref 0.0–0.2)

## 2023-02-28 MED ORDER — ASPIRIN 81 MG PO CHEW: 81.0000 mg | CHEWABLE_TABLET | Freq: Two times a day (BID) | ORAL | 0 refills | Status: AC

## 2023-02-28 MED ORDER — POLYETHYLENE GLYCOL 3350 17 G PO PACK: 17.0000 g | PACK | Freq: Two times a day (BID) | ORAL | 0 refills | Status: AC

## 2023-02-28 MED ORDER — TRAMADOL HCL 50 MG PO TABS: 50.0000 mg | ORAL_TABLET | Freq: Four times a day (QID) | ORAL | 0 refills | Status: AC | PRN

## 2023-02-28 MED ORDER — SODIUM CHLORIDE 0.9 % IV BOLUS
500.0000 mL | Freq: Once | INTRAVENOUS | Status: AC
  Administered 2023-02-28: 500 mL via INTRAVENOUS

## 2023-02-28 MED ORDER — METHOCARBAMOL 500 MG PO TABS: 500.0000 mg | ORAL_TABLET | Freq: Four times a day (QID) | ORAL | 1 refills | Status: AC | PRN

## 2023-02-28 MED ORDER — ACETAMINOPHEN 500 MG PO TABS: 1000.0000 mg | ORAL_TABLET | Freq: Four times a day (QID) | ORAL | Status: AC

## 2023-02-28 MED ORDER — MELOXICAM 15 MG PO TABS: 15.0000 mg | ORAL_TABLET | Freq: Every day | ORAL | 1 refills | Status: AC

## 2023-02-28 MED ORDER — SENNA 8.6 MG PO TABS: 2.0000 | ORAL_TABLET | Freq: Every day | ORAL | 0 refills | Status: AC

## 2023-02-28 NOTE — Progress Notes (Signed)
   Subjective: 1 Day Post-Op Procedure(s) (LRB): TOTAL HIP ARTHROPLASTY ANTERIOR APPROACH (Right) Patient reports pain as mild.   Patient seen in rounds with Dr. Charlann Boxer. Patient is resting in bed on exam this morning. No acute events overnight. Foley catheter removed. Patient was not able to ambulate with PT due to orthostatic symptoms.  We will start therapy today.   Objective: Vital signs in last 24 hours: Temp:  [97.5 F (36.4 C)-98.4 F (36.9 C)] 98.2 F (36.8 C) (12/18 0515) Pulse Rate:  [54-84] 66 (12/18 0515) Resp:  [10-18] 16 (12/18 0515) BP: (108-178)/(56-86) 121/56 (12/18 0515) SpO2:  [92 %-100 %] 100 % (12/18 0515) Weight:  [81.2 kg] 81.2 kg (12/17 0922)  Intake/Output from previous day:  Intake/Output Summary (Last 24 hours) at 02/28/2023 0734 Last data filed at 02/28/2023 1610 Gross per 24 hour  Intake 1758.28 ml  Output 1700 ml  Net 58.28 ml     Intake/Output this shift: No intake/output data recorded.  Labs: Recent Labs    02/28/23 0345  HGB 8.8*   Recent Labs    02/28/23 0345  WBC 11.7*  RBC 2.68*  HCT 26.6*  PLT 120*   Recent Labs    02/28/23 0345  NA 136  K 4.0  CL 105  CO2 23  BUN 33*  CREATININE 1.35*  GLUCOSE 185*  CALCIUM 8.3*   No results for input(s): "LABPT", "INR" in the last 72 hours.  Exam: General - Patient is Alert and Oriented Extremity - Neurologically intact Sensation intact distally Intact pulses distally Dorsiflexion/Plantar flexion intact Dressing - dressing C/D/I Motor Function - intact, moving foot and toes well on exam.   Past Medical History:  Diagnosis Date   Anemia    Arthritis    hand hip   BPH with urinary obstruction    Foley catheter in place    Hypertension    Urinary retention    Wears glasses    Wears partial dentures    lower    Assessment/Plan: 1 Day Post-Op Procedure(s) (LRB): TOTAL HIP ARTHROPLASTY ANTERIOR APPROACH (Right) Principal Problem:   S/P total right hip  arthroplasty  Estimated body mass index is 25.68 kg/m as calculated from the following:   Height as of this encounter: 5\' 10"  (1.778 m).   Weight as of this encounter: 81.2 kg. Advance diet Up with therapy D/C IV fluids  DVT Prophylaxis - Aspirin Weight bearing as tolerated.  Hgb stable at 8.8 this AM. Possible orthostatic hypotension per PT - will give a bolus today before he gets up   Plan is to go Home after hospital stay. Plan for discharge today after meeting goals with therapy. Follow up in the office in 2 weeks.   Rosalene Billings, PA-C Orthopedic Surgery [phone] 02/28/2023, 7:34 AM

## 2023-02-28 NOTE — Progress Notes (Signed)
PT TX NOTE  02/28/23 1400  PT Visit Information  Last PT Received On 02/28/23  Assistance Needed Pt progressing very well, meeting goals. Pt is ready to d/c home with family support as needed. Reviewed and reinforced mobility/safety as below and progression of HEP. Dtr present for PT session.  History of Present Illness 89 yo male presents to therapy s/p R THA, anterior approach on 02/27/2023 due to failure of conservative measures. Pt PMH includes but is not limited to: OA, elevated BP, aortic valve insufficiency, anemia, and BPH s/p TURP.  Subjective Data  Patient Stated Goal get back on the golf course  Precautions  Precautions Fall  Restrictions  Weight Bearing Restrictions Per Provider Order No  Pain Assessment  Pain Assessment 0-10  Pain Score 6  Pain Location R LE and hip  Pain Descriptors / Indicators Sore;Tightness  Pain Intervention(s) Limited activity within patient's tolerance;Monitored during session;Premedicated before session;Repositioned  Cognition  Arousal Alert  Behavior During Therapy [facility] for tasks assessed/performed  Overall Cognitive Status Within Functional Limits for tasks assessed  Bed Mobility  General bed mobility comments in recliner  Transfers  Overall transfer level Needs assistance  Equipment used Rolling walker (2 wheels)  Transfers Sit to/from Stand;Bed to chair/wheelchair/BSC  Sit to Stand Contact guard assist;Supervision  General transfer comment cues for hand placement and safety  Ambulation/Gait  Ambulation/Gait assistance Contact guard assist;Supervision  Gait Distance (Feet) 140 Feet  Assistive device Rolling walker (2 wheels)  Gait Pattern/deviations Step-through pattern  General Gait Details verbal cues for RW position, sequence and safety, cues to slow speed of movement. progressionm to step through pattern  Exercises  Exercises Total Joint  Total Joint Exercises  Ankle Circles/Pumps AROM;Both;10 reps  Quad Sets AROM;Both;5 reps  Heel  Slides AROM;AAROM;Right;10 reps  Hip ABduction/ADduction AROM;Strengthening;Right;10 reps  Long Arc Quad AROM;Strengthening;Right;Seated  PT - End of Session  Equipment Utilized During Treatment Gait belt  Activity Tolerance Patient tolerated treatment well  Patient left in chair;with call bell/phone within reach;with chair alarm set;with family/visitor present  Nurse Communication Mobility status;Other (comment)   PT - Assessment/Plan  PT Visit Diagnosis Unsteadiness on feet (R26.81);Other abnormalities of gait and mobility (R26.89);Muscle weakness (generalized) (M62.81);Pain;Difficulty in walking, not elsewhere classified (R26.2)  Pain - Right/Left Right  Pain - part of body Leg;Hip  PT Frequency (ACUTE ONLY) 7X/week  Follow Up Recommendations Follow physician's recommendations for discharge plan and follow up therapies  Patient can return home with the following A little help with walking and/or transfers;A little help with bathing/dressing/bathroom;Assistance with cooking/housework;Assist for transportation;Help with stairs or ramp for entrance  PT equipment Rolling walker (2 wheels)  AM-PAC PT "6 Clicks" Mobility Outcome Measure (Version 2)  Help needed turning from your back to your side while in a flat bed without using bedrails? 4  Help needed moving from lying on your back to sitting on the side of a flat bed without using bedrails? 3  Help needed moving to and from a bed to a chair (including a wheelchair)? 3  Help needed standing up from a chair using your arms (e.g., wheelchair or bedside chair)? 3  Help needed to walk in hospital room? 3  Help needed climbing 3-5 steps with a railing?  3  6 Click Score 19  Consider Recommendation of Discharge To: Home with [facility]  PT Goal Progression  Progress towards PT goals Progressing toward goals  Acute Rehab PT Goals  PT Goal Formulation With patient  Time For Goal Achievement 03/13/23  Potential  to Achieve Goals Good  PT Time  Calculation  PT Start Time (ACUTE ONLY) 1417  PT Stop Time (ACUTE ONLY) 1442  PT Time Calculation (min) (ACUTE ONLY) 25 min  PT General Charges  $$ ACUTE PT VISIT 1 Visit  PT Treatments  $Gait Training 8-22 mins  $Therapeutic Exercise 8-22 mins

## 2023-02-28 NOTE — TOC Transition Note (Signed)
Transition of Care [facility]) - Discharge Note   Patient Details  Name: Terrence Hinton MRN: [id number] Date of Birth: [birth year]  [facility] of [facility] CM/SW Contact:  Amada Jupiter, LCSW Phone Number: 02/28/2023, 12:05 PM   Clinical Narrative:     Met with pt and confirming he has received RW to room via [employer].   Plan for HEP.  No further TOC needs.  Final next level of care: Home/Self Care Barriers to Discharge: No Barriers Identified   Patient Goals and CMS Choice Patient states their goals for this hospitalization and ongoing recovery are:: return home          Discharge Placement                       Discharge Plan and Services Additional resources added to the After Visit Summary for                  DME Arranged: Walker rolling DME Agency: [employer]                  Social Drivers of Health (SDOH) Interventions SDOH Screenings   Food Insecurity: No Food Insecurity (02/27/2023)  Housing: Unknown (02/27/2023)  Transportation Needs: No Transportation Needs (02/27/2023)  Utilities: Not At Risk (02/27/2023)  Depression (PHQ2-9): Low Risk  (06/24/2019)  Tobacco Use: Medium Risk (02/27/2023)     Readmission Risk Interventions     No data to display

## 2023-02-28 NOTE — Care Management Obs Status (Signed)
MEDICARE OBSERVATION STATUS NOTIFICATION   Patient Details  Name: Payden Vallely MRN: [id number] Date of Birth: 19-Apr-1931   Medicare Observation Status Notification Given:  Hart Robinsons, LCSW 02/28/2023, 2:49 PM

## 2023-02-28 NOTE — Plan of Care (Signed)
  Problem: Education: Goal: Knowledge of General Education information will improve Description: Including pain rating scale, medication(s)/side effects and non-pharmacologic comfort measures Outcome: Progressing   Problem: Activity: Goal: Risk for activity intolerance will decrease Outcome: Progressing   Problem: Pain Management: Goal: General experience of comfort will improve Outcome: Progressing

## 2023-02-28 NOTE — Progress Notes (Signed)
Physical Therapy Treatment Patient Details Name: Terrence Hinton MRN: [id number] DOB: 09-Jun-1931 Today's Date: 02/28/2023   History of Present Illness 89 yo male presents to therapy s/p R THA, anterior approach on 02/27/2023 due to failure of conservative measures. Pt PMH includes but is not limited to: OA, elevated BP, aortic valve insufficiency, anemia, and BPH s/p TURP.    PT Comments  Pt making good progress this session. Wife and dtr present and supportive, engaged in care. Pt rreported feelign "weird" however no overt dizziness, no diaphoresis. Reviewed areas below, home safety, steady progression and not over doing during early stages of healing as well as moving slower than baseline for additional safety/ fall prevention.    If plan is discharge home, recommend the following: A little help with walking and/or transfers;A little help with bathing/dressing/bathroom;Assistance with cooking/housework;Assist for transportation;Help with stairs or ramp for entrance   Can travel by private vehicle        Equipment Recommendations  Rolling walker (2 wheels)    Recommendations for Other Services       Precautions / Restrictions Precautions Precautions: Fall Restrictions Weight Bearing Restrictions Per Provider Order: No     Mobility  Bed Mobility Overal bed mobility: Needs Assistance Bed Mobility: Supine to Sit     Supine to sit: Supervision, HOB elevated     General bed mobility comments: min cues for sliding R LE to EOB    Transfers Overall transfer level: Needs assistance Equipment used: Rolling walker (2 wheels) Transfers: Sit to/from Stand, Bed to chair/wheelchair/BSC Sit to Stand: Contact guard assist   Step pivot transfers: Contact guard assist       General transfer comment: cues for hand placement and safety    Ambulation/Gait Ambulation/Gait assistance: Contact guard assist Gait Distance (Feet): 120 Feet Assistive device: Rolling walker (2 wheels) Gait  Pattern/deviations: Step-through pattern       General Gait Details: verbal cues for RW position, sequence and safety, cues to slow speed of movement   Stairs Stairs: Yes Stairs assistance: Contact guard assist, Min assist Stair Management: One rail Right, With cane Number of Stairs: 3 General stair comments: cues for sequence and technique with SPC and rail. no LOB. pt wife reports pt can likely reach bil handrails   Wheelchair Mobility     Tilt Bed    Modified Rankin (Stroke Patients Only)       Balance                                            Cognition Arousal: Alert Behavior During Therapy: WFL for tasks assessed/performed Overall Cognitive Status: Within Functional Limits for tasks assessed Area of Impairment: Attention, Following commands, Safety/judgement, Awareness                                        Exercises Total Joint Exercises Ankle Circles/Pumps: AROM, Both, 10 reps    General Comments        Pertinent Vitals/Pain Pain Assessment Pain Assessment: 0-10 Pain Score: 5  Pain Location: R LE and hip Pain Descriptors / Indicators: Sore, Tightness Pain Intervention(s): Limited activity within patient's tolerance, Monitored during session, Premedicated before session, Repositioned    Home Living  Prior Function            PT Goals (current goals can now be found in the care plan section) Acute Rehab PT Goals Patient Stated Goal: get back on the golf course PT Goal Formulation: With patient Time For Goal Achievement: 03/13/23 Potential to Achieve Goals: Good Progress towards PT goals: Progressing toward goals    Frequency    7X/week      PT Plan      Co-evaluation              AM-PAC PT "6 Clicks" Mobility   Outcome Measure  Help needed turning from your back to your side while in a flat bed without using bedrails?: None Help needed moving from lying  on your back to sitting on the side of a flat bed without using bedrails?: A Little Help needed moving to and from a bed to a chair (including a wheelchair)?: A Little Help needed standing up from a chair using your arms (e.g., wheelchair or bedside chair)?: A Little Help needed to walk in hospital room?: A Little Help needed climbing 3-5 steps with a railing? : A Little 6 Click Score: 19    End of Session Equipment Utilized During Treatment: Gait belt Activity Tolerance: Treatment limited secondary to medical complications (Comment);Patient tolerated treatment well Patient left: in chair;with call bell/phone within reach;with chair alarm set;with family/visitor present Nurse Communication: Mobility status;Other (comment) PT Visit Diagnosis: Unsteadiness on feet (R26.81);Other abnormalities of gait and mobility (R26.89);Muscle weakness (generalized) (M62.81);Pain;Difficulty in walking, not elsewhere classified (R26.2) Pain - Right/Left: Right Pain - part of body: Leg;Hip     Time: 4098-1191 PT Time Calculation (min) (ACUTE ONLY): 23 min  Charges:    $Gait Training: 23-37 mins PT General Charges $$ ACUTE PT VISIT: 1 Visit                     Gracemarie Skeet, PT  Acute Rehab Dept [facility]) [phone]  02/28/2023    [facility] 02/28/2023, 11:09 AM

## 2023-03-20 DIAGNOSIS — R109 Unspecified abdominal pain: Secondary | ICD-10-CM | POA: Diagnosis not present

## 2023-03-21 ENCOUNTER — Other Ambulatory Visit: Payer: Self-pay | Admitting: Internal Medicine

## 2023-03-21 ENCOUNTER — Ambulatory Visit
Admission: RE | Admit: 2023-03-21 | Discharge: 2023-03-21 | Disposition: A | Discharge status: Home / Self Care | Payer: Medicare HMO | Source: Ambulatory Visit | Attending: Internal Medicine | Admitting: Internal Medicine

## 2023-03-21 DIAGNOSIS — R109 Unspecified abdominal pain: Secondary | ICD-10-CM

## 2023-03-21 DIAGNOSIS — K5900 Constipation, unspecified: Secondary | ICD-10-CM | POA: Diagnosis not present

## 2023-04-18 DIAGNOSIS — Z96641 Presence of right artificial hip joint: Secondary | ICD-10-CM | POA: Diagnosis not present

## 2023-05-24 DIAGNOSIS — Z471 Aftercare following joint replacement surgery: Secondary | ICD-10-CM | POA: Diagnosis not present

## 2023-05-24 DIAGNOSIS — Z96641 Presence of right artificial hip joint: Secondary | ICD-10-CM | POA: Diagnosis not present

## 2023-08-11 DIAGNOSIS — I10 Essential (primary) hypertension: Secondary | ICD-10-CM | POA: Diagnosis not present

## 2023-08-11 DIAGNOSIS — N1831 Chronic kidney disease, stage 3a: Secondary | ICD-10-CM | POA: Diagnosis not present

## 2023-08-16 ENCOUNTER — Ambulatory Visit: Payer: Self-pay | Admitting: Cardiology

## 2023-10-11 DIAGNOSIS — N1831 Chronic kidney disease, stage 3a: Secondary | ICD-10-CM | POA: Diagnosis not present

## 2023-10-11 DIAGNOSIS — I10 Essential (primary) hypertension: Secondary | ICD-10-CM | POA: Diagnosis not present

## 2023-11-11 DIAGNOSIS — I10 Essential (primary) hypertension: Secondary | ICD-10-CM | POA: Diagnosis not present

## 2023-11-11 DIAGNOSIS — N1831 Chronic kidney disease, stage 3a: Secondary | ICD-10-CM | POA: Diagnosis not present

## 2023-11-27 DIAGNOSIS — Z23 Encounter for immunization: Secondary | ICD-10-CM | POA: Diagnosis not present

## 2023-12-11 DIAGNOSIS — I10 Essential (primary) hypertension: Secondary | ICD-10-CM | POA: Diagnosis not present

## 2023-12-11 DIAGNOSIS — N1831 Chronic kidney disease, stage 3a: Secondary | ICD-10-CM | POA: Diagnosis not present

## 2024-01-11 DIAGNOSIS — I10 Essential (primary) hypertension: Secondary | ICD-10-CM | POA: Diagnosis not present

## 2024-01-11 DIAGNOSIS — N1831 Chronic kidney disease, stage 3a: Secondary | ICD-10-CM | POA: Diagnosis not present

## 2024-01-31 DIAGNOSIS — I10 Essential (primary) hypertension: Secondary | ICD-10-CM | POA: Diagnosis not present

## 2024-01-31 DIAGNOSIS — Z1331 Encounter for screening for depression: Secondary | ICD-10-CM | POA: Diagnosis not present

## 2024-01-31 DIAGNOSIS — Z0000 Encounter for general adult medical examination without abnormal findings: Secondary | ICD-10-CM | POA: Diagnosis not present

## 2024-01-31 DIAGNOSIS — I351 Nonrheumatic aortic (valve) insufficiency: Secondary | ICD-10-CM | POA: Diagnosis not present

## 2024-01-31 DIAGNOSIS — D649 Anemia, unspecified: Secondary | ICD-10-CM | POA: Diagnosis not present

## 2024-01-31 DIAGNOSIS — N1831 Chronic kidney disease, stage 3a: Secondary | ICD-10-CM | POA: Diagnosis not present
